# Patient Record
Sex: Female | Born: 1965 | Race: Black or African American | Hispanic: No | Smoking: Never smoker
Health system: Southern US, Community
[De-identification: ages and names within clinical notes are randomized; demographics above are authoritative.]

## PROBLEM LIST (undated history)

## (undated) DIAGNOSIS — R51 Headache: Secondary | ICD-10-CM

## (undated) DIAGNOSIS — I10 Essential (primary) hypertension: Secondary | ICD-10-CM

## (undated) DIAGNOSIS — R519 Headache, unspecified: Secondary | ICD-10-CM

## (undated) DIAGNOSIS — G932 Benign intracranial hypertension: Secondary | ICD-10-CM

## (undated) HISTORY — DX: Benign intracranial hypertension: G93.2

## (undated) HISTORY — DX: Essential (primary) hypertension: I10

## (undated) HISTORY — DX: Headache: R51

## (undated) HISTORY — DX: Headache, unspecified: R51.9

---

## 1990-07-01 HISTORY — PX: BREAST BIOPSY: SHX20

## 2010-07-01 HISTORY — PX: LAPAROSCOPIC TOTAL HYSTERECTOMY: SUR800

## 2010-07-31 ENCOUNTER — Emergency Department (HOSPITAL_COMMUNITY)
Admission: EM | Admit: 2010-07-31 | Discharge: 2010-07-31 | Payer: Self-pay | Source: Home / Self Care | Admitting: Emergency Medicine

## 2010-07-31 LAB — POCT I-STAT, CHEM 8
Glucose, Bld: 137 mg/dL — ABNORMAL HIGH (ref 70–99)
HCT: 35 % — ABNORMAL LOW (ref 36.0–46.0)
Hemoglobin: 11.9 g/dL — ABNORMAL LOW (ref 12.0–15.0)
Potassium: 3.9 mEq/L (ref 3.5–5.1)
Sodium: 140 mEq/L (ref 135–145)
TCO2: 23 mmol/L (ref 0–100)

## 2010-07-31 LAB — GLUCOSE, CSF: Glucose, CSF: 73 mg/dL (ref 43–76)

## 2010-07-31 LAB — CSF CELL COUNT WITH DIFFERENTIAL
RBC Count, CSF: 0 /mm3
Tube #: 3
WBC, CSF: 1 /mm3 (ref 0–5)

## 2010-07-31 LAB — CBC
MCH: 19.2 pg — ABNORMAL LOW (ref 26.0–34.0)
Platelets: 503 10*3/uL — ABNORMAL HIGH (ref 150–400)
RBC: 4.84 MIL/uL (ref 3.87–5.11)
RDW: 17.4 % — ABNORMAL HIGH (ref 11.5–15.5)

## 2010-07-31 LAB — GRAM STAIN

## 2010-07-31 LAB — PROTEIN, CSF: Total  Protein, CSF: 19 mg/dL (ref 15–45)

## 2010-07-31 LAB — DIFFERENTIAL
Basophils Absolute: 0 10*3/uL (ref 0.0–0.1)
Eosinophils Absolute: 0 10*3/uL (ref 0.0–0.7)
Lymphocytes Relative: 21 % (ref 12–46)
Lymphs Abs: 2.8 10*3/uL (ref 0.7–4.0)
Monocytes Relative: 7 % (ref 3–12)

## 2010-07-31 LAB — POCT CARDIAC MARKERS: Troponin i, poc: <0.05 ng/mL (ref 0.00–0.09)

## 2010-08-04 LAB — CSF CULTURE W GRAM STAIN
Culture: NO GROWTH
Gram Stain: NONE SEEN

## 2010-09-03 NOTE — Consult Note (Signed)
Rebecca Hobbs, Rebecca Hobbs               ACCOUNT NO.:  [id number]  MEDICAL RECORD NO.:  [id number]          PATIENT TYPE:  EMS  LOCATION:  [facility]                         FACILITY:  [facility]  PHYSICIAN:  Joycelyn Schmid, MD   DATE OF BIRTH:  [birth year]  DATE OF CONSULTATION:  07/31/2010 DATE OF DISCHARGE:  07/31/2010                                CONSULTATION   REASON FOR CONSULTATION:  Headache associated with left visual field deficit.  HISTORY OF PRESENT ILLNESS:  This is a 45 year old African American female with past medical history only significant for vaginal bleeding over the past 5-6 months and recently placed on Necon without any side effects.  The patient was at her baseline status up until Sunday mid afternoon when she noted bilateral trapezial and posterior neck discomfort.  This was followed by left periorbital discomfort and she describes a "black spot in the center of her revision with wavy lines inher left visual field."  The patient's periorbital discomfort and visual defects did not resolve Sunday evening.  In fact, she states that it progressively gotten worse over the day during Monday.  During the duration of Monday, her main complaint at that time was visual field defect in her left vision along with neck discomfort.  The patient was brought to the emergency department on July 31, 2010, for further evaluation.  While in the emergency room, the patient was given Reglan and Benadryl which she feels might have improved her headache and periorbital discomfort, however, she still is complaining of a black spot in her visual field on the left.  The patient was sent to MRI which showed no ventricular enlargement, edema, or stroke; however, did show positive optic nerve sheath dilatation.  At the present time, the patient has been sent for a fluoro-guided LP.  Past medical history includes vaginal bleeding over the past 5-6 months, recently placed on  Necon.  MEDICATIONS:  Necon.  ALLERGIES:  No known allergies to environmental factors or drugs.  FAMILY HISTORY:  Mother has a history of migraine headaches and hypertension.  Father is deceased secondary to lung cancer and also had hypertension.  SOCIAL HISTORY:  The patient does not use tobacco, alcohol, or illicit drugs.  REVIEW OF SYSTEMS:  Negative with the exception of above.  PHYSICAL EXAMINATION:  VITAL SIGNS:  The patient's temperature is 98.5, blood pressure 115/96, heart rate ranges between 129 and 88, respirations 20. LUNGS:  Clear to auscultation bilaterally. NECK:  Negative for bruits, supple with full range of motion. CARDIOVASCULAR:  Regular rate and rhythm. ABDOMEN:  Soft. HEENT:  Pupils are equal, round, reactive to light approximately 3-2 mm. Extraocular movements were grossly intact.  Tongue was midline.  Face was equal and symmetrical.  Vision was grossly intact to bilateral simultaneous stimuli. NEUROLOGIC:  Facial sensation was grossly intact throughout.  Coordination:  Finger-to-nose and heel-to-shin were smooth. MUSCULOSKELETAL:  The patient showed 5/5 strength throughout.  Sensation:  The patient full sensation to pinprick, light touch throughout.  Deep tendon reflexes show 2+, downgoing toes bilaterally.  Test reveals sodium 140, potassium 3.9, chloride 105, CO2 of 23, BUN 4, creatinine 0.9,  glucose 137.  White blood cell count was elevated at 13.3, platelets 503, hemoglobin 9.3, hematocrit 31.7.  EKG showed normal sinus rhythm, at times tachycardic.  MRI of brain showed negative for any acute infarct, mass, bleed. However, did show optic nerve sheath dilatation.  CT of head was negative.  Fluoro-guided LP is pending at this time.  ASSESSMENT:  This is a pleasant 45 year old African American female with 3-day history of neck discomfort, left periorbital discomfort, and left visual field abnormality.  At this time, the patient is undergoing  a fluoro-guided LP for further evaluation.  Differential diagnosis includes pseudotumor cerebri versus migraine headache.  RECOMMENDATION: 1. Follow up on fluoro-guided LP. 2. Follow up with Ophthalmology.  We will continue to follow the     patient while she is in the hospital.     [name], PA-C   ______________________________ Joycelyn Schmid, MD    DS/MEDQ  D:  07/31/2010  T:  08/01/2010  Job:  [id number]  cc:   Joycelyn Schmid, MD  Electronically Signed by [name] PA-C on 08/02/2010 09:09:25 AM Electronically Signed by Joycelyn Schmid  on 09/03/2010 12:21:17 PM

## 2010-10-22 ENCOUNTER — Ambulatory Visit: Payer: Self-pay | Admitting: Unknown Physician Specialty

## 2010-11-02 ENCOUNTER — Ambulatory Visit: Payer: Self-pay | Admitting: Unknown Physician Specialty

## 2010-11-05 LAB — PATHOLOGY REPORT

## 2013-06-16 ENCOUNTER — Ambulatory Visit: Payer: Self-pay | Admitting: Adult Health

## 2013-07-08 ENCOUNTER — Ambulatory Visit (INDEPENDENT_AMBULATORY_CARE_PROVIDER_SITE_OTHER): Payer: 59 | Admitting: Adult Health

## 2013-07-08 ENCOUNTER — Encounter: Payer: Self-pay | Admitting: Adult Health

## 2013-07-08 ENCOUNTER — Encounter (INDEPENDENT_AMBULATORY_CARE_PROVIDER_SITE_OTHER): Payer: Self-pay

## 2013-07-08 VITALS — BP 152/94 | HR 95 | Temp 97.9°F | Resp 12 | Ht 70.25 in | Wt 205.5 lb

## 2013-07-08 DIAGNOSIS — Z0000 Encounter for general adult medical examination without abnormal findings: Secondary | ICD-10-CM | POA: Insufficient documentation

## 2013-07-08 DIAGNOSIS — G479 Sleep disorder, unspecified: Secondary | ICD-10-CM

## 2013-07-08 DIAGNOSIS — F418 Other specified anxiety disorders: Secondary | ICD-10-CM | POA: Insufficient documentation

## 2013-07-08 DIAGNOSIS — F341 Dysthymic disorder: Secondary | ICD-10-CM

## 2013-07-08 DIAGNOSIS — Z23 Encounter for immunization: Secondary | ICD-10-CM | POA: Insufficient documentation

## 2013-07-08 DIAGNOSIS — I10 Essential (primary) hypertension: Secondary | ICD-10-CM | POA: Insufficient documentation

## 2013-07-08 DIAGNOSIS — Z1239 Encounter for other screening for malignant neoplasm of breast: Secondary | ICD-10-CM

## 2013-07-08 MED ORDER — SERTRALINE HCL 50 MG PO TABS: 50.0000 mg | ORAL_TABLET | Freq: Every day | ORAL | Status: DC

## 2013-07-08 MED ORDER — LISINOPRIL-HYDROCHLOROTHIAZIDE 10-12.5 MG PO TABS: 1.0000 | ORAL_TABLET | Freq: Every day | ORAL | Status: DC

## 2013-07-08 NOTE — Patient Instructions (Signed)
  Please have labs drawn at [facility] today - these have to be fasting labs.  Start Lisinopril/HCTZ 10mg /12.5mg  daily for blood pressure  Start Zoloft 50 mg daily for anxiety, depression.  Have second set of blood work to check kidney and potassium level 1 week after starting lisinopril  Return for follow up in 4 weeks.  Thank you for choosing [place] [facility].  The results of your labs will be available through MyChart for your convenience. Please remember to activate this. The activation code is located at the end of this form.  You received your flu vaccine today

## 2013-07-08 NOTE — Assessment & Plan Note (Signed)
Try melatonin or valerian root. Return in 1 month.

## 2013-07-08 NOTE — Assessment & Plan Note (Addendum)
Physical exam. Labs ordered: CBC with differential, comprehensive metabolic panel, TSH, vitamin D, B12, lipids. Patient will have these done at [facility]. Request medical records from previous PCP. Pap done in 2012 and patient reports normal. Next Will be due 2017. Mammogram scheduled.

## 2013-07-08 NOTE — Assessment & Plan Note (Addendum)
Start lisinopril/HCTZ 10 mg/12.5 mg. Baseline metabolic panel. Recheck creatinine and potassium in one week. Patient will have this drawn at [facility]. Followup in one month

## 2013-07-08 NOTE — Progress Notes (Signed)
Subjective:    Patient ID: Rebecca Hobbs, female    DOB: 03/09/1966, 48 y.o.   MRN: [id number]  HPI  Rebecca Hobbs is a very pleasant 48 y/o female who presents to establish care. She was previously followed by Dr. Shelah Hobbs in [place]. Will request records. She has been experiencing periods of crying. She reports these occur ~ 3 times per week. She is having increasing stress and work. Home life is good. Having trouble sleeping. Falls asleep easily but cannot stay asleep. Hx of pseudotumor cerebri syndrome. She was followed by Dr. Marjory Hobbs at [facility]. Took diamox for ~ 1 year with symptoms (HA) improving considerably. She is no longer on this medication and she has been discharged from Neurology.   Last Mammogram years ago. Schedule now PAP 2012 - Normal per her report - next in 2017 Tetanus 2012   Past Medical History  Diagnosis Date  . Hypertension     elevated b/p readings x 2 years.  . Frequent headaches   . Pseudotumor cerebri syndrome      Past Surgical History  Procedure Laterality Date  . Laparoscopic total hysterectomy  2012    Cervix intact     Family History  Problem Relation Age of Onset  . Hypertension Mother   . Hypertension Father   . COPD Father   . Heart disease Father   . Hypertension Brother   . Diabetes Maternal Grandmother   . Arthritis Maternal Grandfather   . Diabetes Paternal Grandmother   . Diverticulitis Sister   . Heart disease Brother 50    deceased from MI  . Hypertension Brother   . Hypertension Brother      History   Social History  . Marital Status: Divorced    Spouse Name: N/A    Number of Children: 1  . Years of Education: 12   Occupational History  . Project [employer]    18 years   Social History Main Topics  . Smoking status: Never Smoker   . Smokeless tobacco: Never Used  . Alcohol Use: Yes     Comment: 1 glass of wine monthly, if any  . Drug Use: No  . Sexual Activity: Yes    Birth  Control/ Protection: Surgical   Other Topics Concern  . Not on file   Social History Narrative   Rebecca Hobbs grew up in [place], [place]. She is divorced and has 1 daughter (Rebecca Hobbs age 70) and a grandson Rebecca Hobbs). Her daughter and grandson live with her. 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 She makes business cards, church bulletins or anything for special occasions. She attends Rebecca Hobbs and is very active in her church.     Review of Systems  Constitutional: Negative.   HENT: Negative.   Eyes: Negative.   Respiratory: Negative.   Cardiovascular: Negative for chest pain, palpitations and leg swelling.       Elevated b/p reading x 2 years. Has never been on medication.  Gastrointestinal: Negative.   Endocrine: Negative.   Genitourinary: Negative.   Musculoskeletal: Negative.   Skin: Negative.   Allergic/Immunologic: Negative.   Neurological: Negative.   Hematological: Negative.   Psychiatric/Behavioral: Positive for sleep disturbance. Negative for suicidal ideas, behavioral problems, confusion, decreased concentration and agitation. The patient is nervous/anxious.        Has taken Advil PM  to help her sleep. Has been experiencing crying episodes ~ 3 times weekly. Feels stressed at work.       Objective:   Physical Exam  Constitutional: She is oriented to person, place, and time. She appears well-developed and well-nourished. No distress.  HENT:  Head: Normocephalic and atraumatic.  Right Ear: External ear normal.  Left Ear: External ear normal.  Nose: Nose normal.  Mouth/Throat: Oropharynx is clear and moist.  Eyes: Conjunctivae and EOM are normal. Pupils are equal, round, and reactive to light.  Neck: Normal range of motion. Neck supple. No tracheal deviation present. No thyromegaly present.  Cardiovascular: Normal rate, regular rhythm,  normal heart sounds and intact distal pulses.  Exam reveals no gallop and no friction rub.   No murmur heard. Pulmonary/Chest: Effort normal and breath sounds normal. No respiratory distress. She has no wheezes. She has no rales.  Abdominal: Soft. Bowel sounds are normal. She exhibits no distension and no mass. There is no tenderness. There is no rebound and no guarding.  Musculoskeletal: Normal range of motion. She exhibits no edema and no tenderness.  Lymphadenopathy:    She has no cervical adenopathy.  Neurological: She is alert and oriented to person, place, and time. She has normal reflexes. No cranial nerve deficit. Coordination normal.  Skin: Skin is warm and dry.  Psychiatric: She has a normal mood and affect. Her behavior is normal. Judgment and thought content normal.    BP 152/94  Pulse 95  Temp(Src) 97.9 F (36.6 C) (Oral)  Resp 12  Ht 5' 10.25" (1.784 m)  Wt 205 lb 8 oz (93.214 kg)  BMI 29.29 kg/m2  SpO2 99%       Assessment & Plan:

## 2013-07-08 NOTE — Assessment & Plan Note (Signed)
Increasing work stressors. Crying spells. Start Zoloft 50 mg daily. Return to clinic for followup in one month

## 2013-07-08 NOTE — Assessment & Plan Note (Signed)
Mammogram ordered

## 2013-07-08 NOTE — Progress Notes (Signed)
Pre visit review using our clinic review tool, if applicable. No additional management support is needed unless otherwise documented below in the visit note. 

## 2013-07-08 NOTE — Assessment & Plan Note (Signed)
Vaccine administered during clinic today

## 2013-07-09 ENCOUNTER — Telehealth: Payer: Self-pay | Admitting: Adult Health

## 2013-07-09 NOTE — Telephone Encounter (Signed)
Relevant patient education assigned to patient using Emmi. ° °

## 2013-07-16 ENCOUNTER — Telehealth: Payer: Self-pay | Admitting: *Deleted

## 2013-07-16 NOTE — Telephone Encounter (Signed)
Raquel reviewed labs from [facility], all normal. Pt notified. States no complaints with Lisinopril HCT.

## 2013-08-05 ENCOUNTER — Other Ambulatory Visit: Payer: Self-pay | Admitting: Adult Health

## 2013-08-05 ENCOUNTER — Encounter: Payer: Self-pay | Admitting: Adult Health

## 2013-08-05 ENCOUNTER — Ambulatory Visit (INDEPENDENT_AMBULATORY_CARE_PROVIDER_SITE_OTHER): Payer: 59 | Admitting: Adult Health

## 2013-08-05 VITALS — BP 130/84 | HR 108 | Resp 12 | Wt 255.5 lb

## 2013-08-05 DIAGNOSIS — F341 Dysthymic disorder: Secondary | ICD-10-CM

## 2013-08-05 DIAGNOSIS — Z79899 Other long term (current) drug therapy: Secondary | ICD-10-CM

## 2013-08-05 DIAGNOSIS — I10 Essential (primary) hypertension: Secondary | ICD-10-CM

## 2013-08-05 DIAGNOSIS — F418 Other specified anxiety disorders: Secondary | ICD-10-CM

## 2013-08-05 MED ORDER — LISINOPRIL-HYDROCHLOROTHIAZIDE 10-12.5 MG PO TABS: 1.0000 | ORAL_TABLET | Freq: Every day | ORAL | Status: DC

## 2013-08-05 MED ORDER — SERTRALINE HCL 50 MG PO TABS: 50.0000 mg | ORAL_TABLET | Freq: Every day | ORAL | Status: DC

## 2013-08-05 NOTE — Progress Notes (Signed)
Patient ID: Rebecca Hobbs, female   DOB: 07-02-1965, 48 y.o.   MRN: [id number]    Subjective:    Patient ID: Rebecca Hobbs, female    DOB: 07-02-1965, 48 y.o.   MRN: [id number]  HPI  Patient is a pleasant 48 year old female who presents to clinic for followup of hypertension and depression with anxiety. She was started on lisinopril/HCTZ. She reports taking medication on a daily basis. No side effects. Blood pressure is well controlled. As far as her depression with anxiety, she was started on Zoloft. She reports improvement in her symptoms. She would like to continue this medication.   Past Medical History  Diagnosis Date  . Hypertension     elevated b/p readings x 2 years.  . Frequent headaches   . Pseudotumor cerebri syndrome     Current Outpatient Prescriptions on File Prior to Visit  Medication Sig Dispense Refill  . Multiple Vitamin (MULTIVITAMIN) tablet Take 1 tablet by mouth daily.       No current facility-administered medications on file prior to visit.     Review of Systems  Constitutional: Negative.   HENT: Negative.   Eyes: Negative.   Respiratory: Negative.  Negative for chest tightness and shortness of breath.   Cardiovascular: Negative.  Negative for chest pain, palpitations and leg swelling.       Blood pressure improved  Gastrointestinal: Negative.   Endocrine: Negative.   Genitourinary: Negative.   Musculoskeletal: Negative.   Skin: Negative.   Allergic/Immunologic: Negative.   Neurological: Negative.   Hematological: Negative.   Psychiatric/Behavioral: Negative.        Symptoms of depression and anxiety have improved since taking Zoloft       Objective:  BP 130/84  Pulse 108  Resp 12  Wt 255 lb 8 oz (115.894 kg)  SpO2 95%   Physical Exam  Constitutional: She is oriented to person, place, and time. She appears well-developed and well-nourished. No distress.  Cardiovascular: Normal rate, regular rhythm and normal heart sounds.  Exam reveals  no gallop and no friction rub.   No murmur heard. Pulmonary/Chest: Effort normal and breath sounds normal. No respiratory distress. She has no wheezes. She has no rales.  Musculoskeletal: Normal range of motion.  Neurological: She is alert and oriented to person, place, and time.  Skin: Skin is warm and dry.  Psychiatric: She has a normal mood and affect. Her behavior is normal. Judgment and thought content normal.          Assessment & Plan:    1. HTN (hypertension) Continue Lisinopril/HCTZ 10 mg/12.5 mg daily Refills provided. RTC for f/u 6 mo or sooner if necessary Monitor B/P weekly and report. Bring at next visit.  - Basic metabolic panel  2. Medication management Check labs since starting ACE/diuretic  - Basic metabolic panel  3. Depression with anxiety Doing well on Zoloft. Continue to follow. Refills sent. RTC for f/u 6 mo

## 2013-08-05 NOTE — Patient Instructions (Signed)
  Have your blood work drawn at [facility]   Keep record of your blood pressure readings weekly and bring with you at your next visit in 6 months.  I have sent in refills for your b/p medication and zoloft.  Please call with any questions or concerns.

## 2013-08-05 NOTE — Progress Notes (Signed)
Pre visit review using our clinic review tool, if applicable. No additional management support is needed unless otherwise documented below in the visit note. 

## 2013-08-06 ENCOUNTER — Telehealth: Payer: Self-pay | Admitting: Adult Health

## 2013-08-06 NOTE — Telephone Encounter (Signed)
Relevant patient education mailed to patient.  

## 2013-08-10 ENCOUNTER — Telehealth: Payer: Self-pay | Admitting: Adult Health

## 2013-08-10 ENCOUNTER — Encounter: Payer: Self-pay | Admitting: Adult Health

## 2013-08-10 ENCOUNTER — Other Ambulatory Visit: Payer: Self-pay | Admitting: Adult Health

## 2013-08-10 DIAGNOSIS — R928 Other abnormal and inconclusive findings on diagnostic imaging of breast: Secondary | ICD-10-CM

## 2013-08-10 NOTE — Telephone Encounter (Signed)
Pt called left message needs referral to surgeon she had an abnormal mammogram.  [place] image  08/07/13

## 2013-08-10 NOTE — Telephone Encounter (Signed)
Raquel, see below.

## 2013-08-12 ENCOUNTER — Encounter: Payer: Self-pay | Admitting: General Surgery

## 2013-08-13 ENCOUNTER — Telehealth: Payer: Self-pay | Admitting: *Deleted

## 2013-08-13 NOTE — Telephone Encounter (Signed)
Pt notified of labs, glucose 181. Pt states she was fasting. Raquel recommended repeat after she sees Dr. Lemar LivingsByrnett. Pt verbalized understanding, lab order for repeat labs left up front for pt pickup.

## 2013-08-18 ENCOUNTER — Other Ambulatory Visit: Payer: Self-pay | Admitting: Adult Health

## 2013-08-18 MED ORDER — ERGOCALCIFEROL 1.25 MG (50000 UT) PO CAPS: 50000.0000 [IU] | ORAL_CAPSULE | ORAL | Status: DC

## 2013-08-18 NOTE — Progress Notes (Signed)
Vitamin D level 9.6 Start Drisdol

## 2013-08-18 NOTE — Telephone Encounter (Signed)
Advised pt that her Vit D is low and Raquel called in Rx, and to make a follow up appt in 12 weeks.  Advised pt that her lab order slip is ready for pick up

## 2013-08-19 ENCOUNTER — Telehealth: Payer: Self-pay | Admitting: *Deleted

## 2013-08-19 ENCOUNTER — Encounter: Payer: Self-pay | Admitting: General Surgery

## 2013-08-19 ENCOUNTER — Ambulatory Visit (INDEPENDENT_AMBULATORY_CARE_PROVIDER_SITE_OTHER): Payer: 59 | Admitting: General Surgery

## 2013-08-19 VITALS — BP 124/78 | HR 78 | Resp 12 | Ht 70.0 in | Wt 253.0 lb

## 2013-08-19 DIAGNOSIS — R920 Mammographic microcalcification found on diagnostic imaging of breast: Secondary | ICD-10-CM

## 2013-08-19 NOTE — Patient Instructions (Addendum)
Patient to return in six months left breast diagnotic mammogram.  

## 2013-08-19 NOTE — Telephone Encounter (Signed)
Pt notified that Vit D is low and that Rx was sent to pharmacy.  Advised pt to follow up in 12 weeks and that the lab order slip was ready for pick up

## 2013-08-19 NOTE — Progress Notes (Addendum)
Patient ID: Rebecca Hobbs, female   DOB: 01-Dec-1965, 48 y.o.   MRN: [id number]  Chief Complaint  Patient presents with  . Other    mammmogram    HPI Rebecca Hobbs is a 48 y.o. female who presents for a breast evaluation. The most recent mammogram was done on 07/08/13. Patient had her last mammogram back in 1992. Patient does perform regular self breast checks and gets regular mammograms done.    HPI  Past Medical History  Diagnosis Date  . Hypertension     elevated b/p readings x 2 years.  . Frequent headaches   . Pseudotumor cerebri syndrome     Past Surgical History  Procedure Laterality Date  . Laparoscopic total hysterectomy  2012    Cervix intact  . Breast biopsy Left 1992    Family History  Problem Relation Age of Onset  . Hypertension Mother   . Hypertension Father   . COPD Father   . Heart disease Father   . Hypertension Brother   . Diabetes Maternal Grandmother   . Arthritis Maternal Grandfather   . Diabetes Paternal Grandmother   . Diverticulitis Sister   . Heart disease Brother 50    deceased from MI  . Hypertension Brother   . Hypertension Brother   . Breast cancer Maternal Aunt     Social History History  Substance Use Topics  . Smoking status: Never Smoker   . Smokeless tobacco: Never Used  . Alcohol Use: Yes     Comment: 1 glass of wine monthly, if any    No Known Allergies  Current Outpatient Prescriptions  Medication Sig Dispense Refill  . lisinopril-hydrochlorothiazide (PRINZIDE,ZESTORETIC) 10-12.5 MG per tablet Take 1 tablet by mouth daily.  30 tablet  6  . Multiple Vitamin (MULTIVITAMIN) tablet Take 1 tablet by mouth daily.      . sertraline (ZOLOFT) 50 MG tablet Take 1 tablet (50 mg total) by mouth daily.  30 tablet  6   No current facility-administered medications for this visit.    Review of Systems Review of Systems  Constitutional: Negative.   Respiratory: Negative.   Cardiovascular: Negative.     Blood pressure  124/78, pulse 78, resp. rate 12, height 5\' 10"  (1.778 m), weight 253 lb (114.76 kg).  Physical Exam Physical Exam  Vitals reviewed. Constitutional: She is oriented to person, place, and time. She appears well-developed and well-nourished.  Eyes: Conjunctivae are normal.  Cardiovascular: Normal rate and regular rhythm.   Murmur heard.  Systolic murmur is present with a grade of 1/6  Pulmonary/Chest: Breath sounds normal. Right breast exhibits no inverted nipple, no mass, no nipple discharge, no skin change and no tenderness. Left breast exhibits no inverted nipple, no mass, no nipple discharge, no skin change and no tenderness. Breasts are asymmetrical ( left breast 1/2 cup size bigger than right breast ).  Well healed incision left breast 5 cm from nipple.   Lymphadenopathy:    She has no cervical adenopathy.    She has no axillary adenopathy.  Neurological: She is alert and oriented to person, place, and time.  Skin: Skin is warm and dry.    Data Reviewed Screening mammograms dated 08/02/2013 as well as diagnostic images dated 08/06/2013 were reviewed. Calcifications were identified and left breast 6 cm from the nipple. BI-RAD-4. These cover a 3 mm area on focal spot compression views.  Assessment    Left breast microcalcifications, first examined 20 years.    Plan  Options for management were reviewed with the patient: 1) early stereotactic biopsy (risks/benefits reviewed) as well as 2) 6 month followup exam. The likelihood of a missed malignancy a small. At this time the patient is comfortable with a 6 month followup exam. She managed to go 20 years in between screening studies (original exam for a palpable mass) and she is not particularly anxious at this time. She was advised that should she desire to proceed early biopsy after reviewing options with her family she should contact the office directly.  Earline MayotteByrnett, Jeffrey W 08/25/2013, 3:07 PM

## 2013-08-20 DIAGNOSIS — R920 Mammographic microcalcification found on diagnostic imaging of breast: Secondary | ICD-10-CM | POA: Insufficient documentation

## 2013-08-23 LAB — BASIC METABOLIC PANEL
BUN/Creatinine Ratio: 11 (ref 9–23)
BUN: 9 mg/dL (ref 6–24)
CALCIUM: 10 mg/dL (ref 8.7–10.2)
CO2: 23 mmol/L (ref 18–29)
CREATININE: 0.82 mg/dL (ref 0.57–1.00)
Chloride: 96 mmol/L — ABNORMAL LOW (ref 97–108)
GFR calc Af Amer: 99 mL/min/{1.73_m2} (ref >59)
GFR, EST NON AFRICAN AMERICAN: 86 mL/min/{1.73_m2} (ref >59)
GLUCOSE: 181 mg/dL — AB (ref 65–99)
Potassium: 4.4 mmol/L (ref 3.5–5.2)
Sodium: 138 mmol/L (ref 134–144)

## 2013-08-25 ENCOUNTER — Encounter: Payer: Self-pay | Admitting: Adult Health

## 2013-08-25 ENCOUNTER — Other Ambulatory Visit: Payer: Self-pay | Admitting: Adult Health

## 2013-08-25 DIAGNOSIS — R739 Hyperglycemia, unspecified: Secondary | ICD-10-CM

## 2013-08-27 ENCOUNTER — Other Ambulatory Visit: Payer: Self-pay | Admitting: Adult Health

## 2013-08-27 NOTE — Telephone Encounter (Signed)
Mailed unread message to pt  

## 2013-09-06 ENCOUNTER — Telehealth: Payer: Self-pay | Admitting: Adult Health

## 2013-09-06 LAB — HGB A1C W/O EAG: HEMOGLOBIN A1C: 7.8 % — AB (ref 4.8–5.6)

## 2013-09-06 NOTE — Telephone Encounter (Signed)
Pt left vm asking for appt with R. Rey for elevated A1c.  Left vm for pt to return call to schedule.

## 2013-09-07 ENCOUNTER — Encounter: Payer: Self-pay | Admitting: Adult Health

## 2013-09-07 ENCOUNTER — Other Ambulatory Visit: Payer: Self-pay | Admitting: Adult Health

## 2013-09-07 MED ORDER — METFORMIN HCL 500 MG PO TABS: 500.0000 mg | ORAL_TABLET | Freq: Two times a day (BID) | ORAL | Status: DC

## 2013-09-07 MED ORDER — GLIMEPIRIDE 2 MG PO TABS: 2.0000 mg | ORAL_TABLET | Freq: Every day | ORAL | Status: DC

## 2013-09-07 NOTE — Progress Notes (Signed)
Notified pt. 

## 2013-09-08 ENCOUNTER — Ambulatory Visit (INDEPENDENT_AMBULATORY_CARE_PROVIDER_SITE_OTHER): Payer: 59 | Admitting: Adult Health

## 2013-09-08 ENCOUNTER — Encounter: Payer: Self-pay | Admitting: Adult Health

## 2013-09-08 VITALS — BP 130/90 | HR 95 | Temp 98.4°F | Resp 14 | Wt 258.0 lb

## 2013-09-08 DIAGNOSIS — R05 Cough: Secondary | ICD-10-CM | POA: Insufficient documentation

## 2013-09-08 DIAGNOSIS — E119 Type 2 diabetes mellitus without complications: Secondary | ICD-10-CM | POA: Insufficient documentation

## 2013-09-08 DIAGNOSIS — R059 Cough, unspecified: Secondary | ICD-10-CM | POA: Insufficient documentation

## 2013-09-08 MED ORDER — BENZONATATE 200 MG PO CAPS: 200.0000 mg | ORAL_CAPSULE | Freq: Two times a day (BID) | ORAL | Status: DC | PRN

## 2013-09-08 NOTE — Patient Instructions (Addendum)
For your cough I have sent in a prescription for tessalon to your pharmacy. You may take this twice a day as needed for cough.   For Diabetes:  I am referring you for diabetic education at [facility]. They will contact you for the details.  For the first two week check your blood glucose level twice a day (before breakfast and dinner) and record in your log. Thereafter check your blood glucose level in the morning before breakfast.  Take the glimepiride in the morning with breakfast and the metformin with breakfast and dinner.   Return for follow up in 3 months. I will check your HgbA1c at that time.  Please feel free to send me a MyChart messages if you have any questions.     Hypoglycemia (Low Blood Sugar) Hypoglycemia is when the glucose (sugar) in your blood is too low. Hypoglycemia can happen for many reasons. It can happen to people with or without diabetes. Hypoglycemia can develop quickly and can be a medical emergency.  CAUSES  Having hypoglycemia does not mean that you will develop diabetes. Different causes include:  Missed or delayed meals or not enough carbohydrates eaten.  Medication overdose. This could be by accident or deliberate. If by accident, your medication may need to be adjusted or changed.  Exercise or increased activity without adjustments in carbohydrates or medications.  A nerve disorder that affects body functions like your heart rate, blood pressure and digestion (autonomic neuropathy).  A condition where the stomach muscles do not function properly (gastroparesis). Therefore, medications may not absorb properly.  The inability to recognize the signs of hypoglycemia (hypoglycemic unawareness).  Absorption of insulin  may be altered.  Alcohol consumption.  Pregnancy/menstrual cycles/postpartum. This may be due to hormones.  Certain kinds of tumors. This is very rare. SYMPTOMS   Sweating.  Hunger.  Dizziness.  Blurred  vision.  Drowsiness.  Weakness.  Headache.  Rapid heart beat.  Shakiness.  Nervousness. DIAGNOSIS  Diagnosis is made by monitoring blood glucose in one or all of the following ways:  Fingerstick blood glucose monitoring.  Laboratory results. TREATMENT  If you think your blood glucose is low:  Check your blood glucose, if possible. If it is less than 70 mg/dl, take one of the following:  3-4 glucose tablets.   cup juice (prefer clear like apple).   cup "regular" soda pop.  1 cup milk.  -1 tube of glucose gel.  5-6 hard candies.  Do not over treat because your blood glucose (sugar) will only go too high.  Wait 15 minutes and recheck your blood glucose. If it is still less than 70 mg/dl (or below your target range), repeat treatment.  Eat a snack if it is more than one hour until your next meal. Sometimes, your blood glucose may go so low that you are unable to treat yourself. You may need someone to help you. You may even pass out or be unable to swallow. This may require you to get an injection of glucagon, which raises the blood glucose. HOME CARE INSTRUCTIONS  Check blood glucose as recommended by your caregiver.  Take medication as prescribed by your caregiver.  Follow your meal plan. Do not skip meals. Eat on time.  If you are going to drink alcohol, drink it only with meals.  Check your blood glucose before driving.  Check your blood glucose before and after exercise. If you exercise longer or different than usual, be sure to check blood glucose more frequently.  Always carry treatment with you. Glucose tablets are the easiest to carry.  Always wear medical alert jewelry or carry some form of identification that states that you have diabetes. This will alert people that you have diabetes. If you have hypoglycemia, they will have a better idea on what to do. SEEK MEDICAL CARE IF:   You are having problems keeping your blood sugar at target  range.  You are having frequent episodes of hypoglycemia.  You feel you might be having side effects from your medicines.  You have symptoms of an illness that is not improving after 3-4 days.  You notice a change in vision or a new problem with your vision. SEEK IMMEDIATE MEDICAL CARE IF:   You are a family member or friend of a person whose blood glucose goes below 70 mg/dl and is accompanied by:  Confusion.  A change in mental status.  The inability to swallow.  Passing out. Document Released: 06/17/2005 Document Revised: 09/09/2011 Document Reviewed: 10/14/2011 [facility] Patient Information 2014 [place], [place].

## 2013-09-08 NOTE — Progress Notes (Signed)
Pre visit review using our clinic review tool, if applicable. No additional management support is needed unless otherwise documented below in the visit note. 

## 2013-09-08 NOTE — Progress Notes (Signed)
Patient ID: LEKESHIA KRAM, female   DOB: February 15, 1966, 48 y.o.   MRN: [id number]    Subjective:    Patient ID: Ferol Luz, female    DOB: 10/15/1965, 48 y.o.   MRN: [id number]  HPI  Lacie is a pleasant 48 year old female newly diagnosed diabetes type 2 with hemoglobin A1c 7.8% who presents to clinic to discuss treatment. She is motivated to keep her blood glucose under control. Last eye appointment was 2 years ago.   She also reports recent cold but now has lingering dry cough. She has been taking mucinex. Hx of bronchitis ~ 1 year ago.  Past Medical History  Diagnosis Date  . Hypertension     elevated b/p readings x 2 years.  . Frequent headaches   . Pseudotumor cerebri syndrome     Current Outpatient Prescriptions on File Prior to Visit  Medication Sig Dispense Refill  . glimepiride (AMARYL) 2 MG tablet Take 1 tablet (2 mg total) by mouth daily before breakfast.  30 tablet  3  . lisinopril-hydrochlorothiazide (PRINZIDE,ZESTORETIC) 10-12.5 MG per tablet Take 1 tablet by mouth daily.  30 tablet  6  . metFORMIN (GLUCOPHAGE) 500 MG tablet Take 1 tablet (500 mg total) by mouth 2 (two) times daily with a meal.  60 tablet  3  . Multiple Vitamin (MULTIVITAMIN) tablet Take 1 tablet by mouth daily.      . sertraline (ZOLOFT) 50 MG tablet Take 1 tablet (50 mg total) by mouth daily.  30 tablet  6   No current facility-administered medications on file prior to visit.   Review of Systems  Constitutional: Negative.   HENT: Negative.   Eyes: Negative.   Respiratory: Negative.   Cardiovascular: Negative.   Gastrointestinal: Negative.   Endocrine: Negative.   Genitourinary: Negative.   Musculoskeletal: Negative.   Skin: Negative.   Allergic/Immunologic: Negative.   Neurological: Negative.   Hematological: Negative.   Psychiatric/Behavioral: Negative.        Objective:  BP 130/90  Pulse 95  Temp(Src) 98.4 F (36.9 C) (Oral)  Resp 14  Wt 258 lb (117.028 kg)  SpO2 93%   Physical Exam  Constitutional: She is oriented to person, place, and time. She appears well-developed and well-nourished. No distress.  HENT:  Head: Normocephalic and atraumatic.  Mouth/Throat: Oropharynx is clear and moist.  Eyes: Conjunctivae and EOM are normal.  Neck: Normal range of motion. Neck supple.  Cardiovascular: Normal rate, regular rhythm, normal heart sounds and intact distal pulses.  Exam reveals no gallop and no friction rub.   No murmur heard. Pulmonary/Chest: Effort normal and breath sounds normal. No respiratory distress. She has no wheezes. She has no rales.  Musculoskeletal: Normal range of motion.  Neurological: She is alert and oriented to person, place, and time. She has normal reflexes.  Skin: Skin is warm and dry.  Psychiatric: She has a normal mood and affect. Her behavior is normal. Judgment and thought content normal.       Assessment & Plan:   1. DM type 2 (diabetes mellitus, type 2) HgbA1c 7.8%. Started glimepiride and metformin. Discussed medication side effects and function. Discussed importance of maintaining good glycemic control to prevent end organ damage. She will schedule a diabetic eye appointment at her earliest convenience. Reviewed diet and will refer to [facility] for diabetic education. Pt was instructed on proper use of glucometer. Check BG bid x 2 weeks and records. Thereafter check daily before breakfast. Information and treatment of hypoglycemia provided.  Importance of exercise and weight control was also discussed. RTC for follow up in 3 months.  - Ambulatory referral to diabetic education  2. Cough Occasional coughing spells. Dry cough. Discussed with pt that cough can linger for several weeks. No shortness of breath reported. No wheezing. No hx of asthma. Tessalon for cough. If no improvement within 1-2 week or if symptoms worsen she will need to be reevaluated.   - benzonatate (TESSALON) 200 MG capsule; Take 1 capsule (200 mg  total) by mouth 2 (two) times daily as needed for cough.  Dispense: 20 capsule; Refill: 0

## 2013-09-13 ENCOUNTER — Encounter: Payer: Self-pay | Admitting: Adult Health

## 2013-09-14 ENCOUNTER — Telehealth: Payer: Self-pay

## 2013-09-14 NOTE — Telephone Encounter (Signed)
Relevant patient education assigned to patient using Emmi. ° °

## 2013-10-05 ENCOUNTER — Telehealth: Payer: Self-pay | Admitting: *Deleted

## 2013-10-05 NOTE — Telephone Encounter (Signed)
Yes, it may be the lisinopril. Have her hold the lisinopril to see if improved cough. I will need to call in a new b/p medication.

## 2013-10-05 NOTE — Telephone Encounter (Signed)
Pt states that the cough medication that she was given at last office visit hasn't been working, Pt is questioning if the cough may be coming from her blood pressure medication

## 2013-10-05 NOTE — Telephone Encounter (Signed)
Left vm advising pt  

## 2013-10-06 NOTE — Telephone Encounter (Signed)
Rebecca FanningJulie, Tomorrow call patient to find out how she is doing. I have not sent in a new prescription for b/p medication yet because I wanted to see if stopping the lisinopril helped her symptoms any.

## 2013-10-07 NOTE — Telephone Encounter (Signed)
Left vm requesting pt to return my phone call 

## 2013-10-07 NOTE — Telephone Encounter (Signed)
Pt states that yesterday was the first day she didn't take the Lisinopril, she can tell a little difference but the cough isnt completely gone

## 2013-10-08 NOTE — Telephone Encounter (Signed)
Please call Ms. Dayton ScrapeMurray and find out how she's doing. Has she taken her blood pressure since off the lisinopril? What are the readings?

## 2013-10-11 NOTE — Telephone Encounter (Signed)
Pt states that her cough is better, only coughs a little and feels that it is due to the pollen in the air.  Blood pressure readings have been 160-170's over 80's

## 2013-10-11 NOTE — Telephone Encounter (Signed)
Pt will be picking up some otc antihistamine and she will retry her lisinopril again as well

## 2013-10-11 NOTE — Telephone Encounter (Signed)
Left message on vm requesting pt to return my call

## 2013-10-11 NOTE — Telephone Encounter (Signed)
Does she want to retry the lisinopril? Tell her to take an otc antihistamine - claritin, allegra or zyrtec

## 2013-12-15 ENCOUNTER — Encounter: Payer: Self-pay | Admitting: Adult Health

## 2013-12-15 ENCOUNTER — Ambulatory Visit (INDEPENDENT_AMBULATORY_CARE_PROVIDER_SITE_OTHER): Payer: 59 | Admitting: Adult Health

## 2013-12-15 VITALS — BP 144/94 | HR 106 | Temp 98.3°F | Resp 14 | Ht 70.25 in | Wt 250.0 lb

## 2013-12-15 DIAGNOSIS — R059 Cough, unspecified: Secondary | ICD-10-CM

## 2013-12-15 DIAGNOSIS — E119 Type 2 diabetes mellitus without complications: Secondary | ICD-10-CM

## 2013-12-15 DIAGNOSIS — R05 Cough: Secondary | ICD-10-CM

## 2013-12-15 MED ORDER — LOSARTAN POTASSIUM-HCTZ 50-12.5 MG PO TABS: 1.0000 | ORAL_TABLET | Freq: Every day | ORAL | Status: DC

## 2013-12-15 NOTE — Progress Notes (Signed)
Pre visit review using our clinic review tool, if applicable. No additional management support is needed unless otherwise documented below in the visit note. 

## 2013-12-15 NOTE — Patient Instructions (Signed)
  Please have your labs drawn  Schedule your diabetic eye exam  Diabetic foot exam done today and normal.  Stop lisinopril and I will send in new prescription for losartan/HCTZ

## 2013-12-15 NOTE — Progress Notes (Signed)
Patient ID: Rebecca LuzRhonda M Hobbs, female   DOB: 04-Jul-1965, 48 y.o.   MRN: [id number]   Subjective:    Patient ID: Rebecca Hobbs, female    DOB: 04-Jul-1965, 48 y.o.   MRN: [id number]  HPI  Patient is a pleasant 48 year old female who presents to clinic for diabetes followup. She needs to schedule her diabetic eye exam. Patient reports that she will do this soon. She is due today for her diabetic foot exam, lipid panel and repeat hemoglobin A1c. She reports checking her blood glucose levels approximately twice a day. Fasting a.m. glucose ranges 120-130. She reports evening glucose levels approximately the same. She has experienced 2 hypoglycemic events where she felt shaky and sweaty. Patient drank orange juice with good results. She followed this by having a small meal. Patient reports that she usually has snacks at work but does not usually carry anything with her in her purse when she is out.  She reports ongoing cough. We had temporarily taken her off lisinopril with some relief. She was restarted on this medication secondary to also having respiratory infection when she was experiencing the cough so we really didn't know which was the reason for her cough. Patient reports that cough is ongoing. No upper respiratory symptom.   Past Medical History  Diagnosis Date  . Hypertension     elevated b/p readings x 2 years.  . Frequent headaches   . Pseudotumor cerebri syndrome     Current Outpatient Prescriptions on File Prior to Visit  Medication Sig Dispense Refill  . glimepiride (AMARYL) 2 MG tablet Take 1 tablet (2 mg total) by mouth daily before breakfast.  30 tablet  3  . metFORMIN (GLUCOPHAGE) 500 MG tablet Take 1 tablet (500 mg total) by mouth 2 (two) times daily with a meal.  60 tablet  3  . Multiple Vitamin (MULTIVITAMIN) tablet Take 1 tablet by mouth daily.      . sertraline (ZOLOFT) 50 MG tablet Take 1 tablet (50 mg total) by mouth daily.  30 tablet  6   No current facility-administered  medications on file prior to visit.     Review of Systems  Constitutional: Negative.   HENT: Negative.   Eyes: Negative.   Respiratory: Negative.   Cardiovascular: Negative.   Gastrointestinal: Negative.   Endocrine: Negative.   Genitourinary: Negative.   Musculoskeletal: Negative.   Skin: Negative.   Allergic/Immunologic: Negative.   Neurological: Negative.   Hematological: Negative.   Psychiatric/Behavioral: Negative.        Objective:  BP 144/94  Pulse 106  Temp(Src) 98.3 F (36.8 C) (Oral)  Resp 14  Ht 5' 10.25" (1.784 m)  Wt 250 lb (113.399 kg)  BMI 35.63 kg/m2  SpO2 97%   Physical Exam  Constitutional: She is oriented to person, place, and time. No distress.  HENT:  Head: Normocephalic and atraumatic.  Eyes: Conjunctivae and EOM are normal.  Neck: Normal range of motion. Neck supple.  Cardiovascular: Normal rate, regular rhythm, normal heart sounds and intact distal pulses.  Exam reveals no gallop and no friction rub.   No murmur heard. Pulmonary/Chest: Effort normal and breath sounds normal. No respiratory distress. She has no wheezes. She has no rales.  Musculoskeletal: Normal range of motion.  Neurological: She is alert and oriented to person, place, and time. She has normal reflexes. Coordination normal.  Skin: Skin is warm and dry.  Psychiatric: She has a normal mood and affect. Her behavior is normal. Judgment and thought content normal.  Assessment & Plan:   1. DMII (diabetes mellitus, type 2) Doing well with diet. Compliant with medication. Check labs. She will have these done at [facility]. Diabetic foot exam normal. She will schedule her diabetic yearly eye exam.   - Lipid panel - Hemoglobin A1c; Future  2. Cough Stop lisinopril/HCTZ. Start Losartan/HCTZ 50 mg/12.5 mg. She will call if cough persists.

## 2013-12-16 LAB — HEMOGLOBIN A1C: HEMOGLOBIN A1C: 6.5 % — AB (ref 4.0–6.0)

## 2013-12-16 LAB — LIPID PANEL: LDL Cholesterol: 100 mg/dL

## 2013-12-26 ENCOUNTER — Telehealth: Payer: Self-pay | Admitting: Adult Health

## 2013-12-26 NOTE — Telephone Encounter (Signed)
A1c has improved (6.5%) F/U 3 month

## 2013-12-28 ENCOUNTER — Telehealth: Payer: Self-pay | Admitting: Adult Health

## 2013-12-28 NOTE — Telephone Encounter (Signed)
Mailed unread message to pt  

## 2013-12-28 NOTE — Telephone Encounter (Signed)
Pt has not read MyChart message 

## 2014-01-04 ENCOUNTER — Encounter: Payer: Self-pay | Admitting: Adult Health

## 2014-01-24 ENCOUNTER — Encounter: Payer: Self-pay | Admitting: *Deleted

## 2014-01-24 NOTE — Progress Notes (Signed)
Chart reviewed for DM bundle. Appt sch 02/02/14. Labs abstracted from 12/16/13

## 2014-02-02 ENCOUNTER — Ambulatory Visit (INDEPENDENT_AMBULATORY_CARE_PROVIDER_SITE_OTHER): Payer: 59 | Admitting: Adult Health

## 2014-02-02 ENCOUNTER — Encounter: Payer: Self-pay | Admitting: Adult Health

## 2014-02-02 VITALS — BP 132/92 | HR 87 | Temp 98.6°F | Resp 14 | Ht 70.25 in | Wt 253.5 lb

## 2014-02-02 DIAGNOSIS — I10 Essential (primary) hypertension: Secondary | ICD-10-CM

## 2014-02-02 MED ORDER — LOSARTAN POTASSIUM-HCTZ 100-12.5 MG PO TABS: 1.0000 | ORAL_TABLET | Freq: Every day | ORAL | Status: DC

## 2014-02-02 NOTE — Progress Notes (Signed)
Patient ID: Rebecca Hobbs, female   DOB: Nov 04, 1965, 48 y.o.   MRN: [id number]   Subjective:    Patient ID: Rebecca Luzhonda M Yaney, female    DOB: Nov 04, 1965, 48 y.o.   MRN: [id number]  HPI Bjorn LoserRhonda is a pleasant 48 y/o female who presents to clinic for HTN follow up. Currently on losartan/HCTZ 50-12.5 mg daily. She reports that her diastolic readings have been in the high 80 (86-89). Her systolic readings have been below 135. She is feeling well overall. She reports stress at work but manageable. Trying to lose weight but this has been a challenge for her. Watches sodium in diet.   Past Medical History  Diagnosis Date  . Hypertension     elevated b/p readings x 2 years.  . Frequent headaches   . Pseudotumor cerebri syndrome     Current Outpatient Prescriptions on File Prior to Visit  Medication Sig Dispense Refill  . glimepiride (AMARYL) 2 MG tablet Take 1 tablet (2 mg total) by mouth daily before breakfast.  30 tablet  3  . metFORMIN (GLUCOPHAGE) 500 MG tablet Take 1 tablet (500 mg total) by mouth 2 (two) times daily with a meal.  60 tablet  3  . Multiple Vitamin (MULTIVITAMIN) tablet Take 1 tablet by mouth daily.      . sertraline (ZOLOFT) 50 MG tablet Take 1 tablet (50 mg total) by mouth daily.  30 tablet  6   No current facility-administered medications on file prior to visit.     Review of Systems  Constitutional: Negative.   HENT: Negative.   Eyes: Negative.   Respiratory: Negative.   Cardiovascular: Negative.   Gastrointestinal: Negative.   Endocrine: Negative.   Genitourinary: Negative.   Musculoskeletal: Negative.   Skin: Negative.   Allergic/Immunologic: Negative.   Neurological: Negative.   Hematological: Negative.   Psychiatric/Behavioral: Negative.        Objective:  BP 132/92  Pulse 87  Temp(Src) 98.6 F (37 C) (Oral)  Resp 14  Ht 5' 10.25" (1.784 m)  Wt 253 lb 8 oz (114.987 kg)  BMI 36.13 kg/m2  SpO2 97%   Physical Exam  Constitutional: She is oriented  to person, place, and time. No distress.  HENT:  Head: Normocephalic and atraumatic.  Eyes: Conjunctivae and EOM are normal.  Neck: Normal range of motion. Neck supple.  Cardiovascular: Normal rate, regular rhythm, normal heart sounds and intact distal pulses.  Exam reveals no gallop and no friction rub.   No murmur heard. Pulmonary/Chest: Effort normal and breath sounds normal. No respiratory distress. She has no wheezes. She has no rales.  Musculoskeletal: Normal range of motion.  Neurological: She is alert and oriented to person, place, and time. She has normal reflexes. Coordination normal.  Skin: Skin is warm and dry.  Psychiatric: She has a normal mood and affect. Her behavior is normal. Judgment and thought content normal.      Assessment & Plan:   1. Essential hypertension Increase losartan to 100 mg and keep HCTZ at 12.5 mg. Prescription sent to pharmacy. Follow up in 6 months for diabetes and HTN.

## 2014-02-02 NOTE — Progress Notes (Signed)
Pre visit review using our clinic review tool, if applicable. No additional management support is needed unless otherwise documented below in the visit note. 

## 2014-02-02 NOTE — Patient Instructions (Signed)
  I have sent in a new prescription for your blood pressure medication. I have increased the losartan to 100 mg and kept the HCTZ (fluid portion) the same at 12.5 mg.  Have your labs drawn at [facility] as soon as possible.  Return in 6 months for diabetes and hypertension follow up.  Enjoy the rest of your summer.

## 2014-02-03 LAB — BASIC METABOLIC PANEL
BUN: 9 mg/dL (ref 4–21)
Creatinine: 0.8 mg/dL (ref 0.5–1.1)
GLUCOSE: 109 mg/dL
Potassium: 4.6 mmol/L (ref 3.4–5.3)
Sodium: 140 mmol/L (ref 137–147)

## 2014-02-04 ENCOUNTER — Encounter: Payer: Self-pay | Admitting: Adult Health

## 2014-02-08 ENCOUNTER — Encounter: Payer: Self-pay | Admitting: General Surgery

## 2014-02-17 ENCOUNTER — Ambulatory Visit: Payer: 59 | Admitting: General Surgery

## 2014-04-21 ENCOUNTER — Encounter: Payer: Self-pay | Admitting: *Deleted

## 2014-04-29 ENCOUNTER — Other Ambulatory Visit: Payer: Self-pay | Admitting: *Deleted

## 2014-04-29 MED ORDER — METFORMIN HCL 500 MG PO TABS: 500.0000 mg | ORAL_TABLET | Freq: Two times a day (BID) | ORAL | Status: DC

## 2014-05-02 ENCOUNTER — Encounter: Payer: Self-pay | Admitting: Adult Health

## 2014-08-02 ENCOUNTER — Other Ambulatory Visit: Payer: Self-pay | Admitting: *Deleted

## 2014-08-02 MED ORDER — GLIMEPIRIDE 2 MG PO TABS: 2.0000 mg | ORAL_TABLET | Freq: Every day | ORAL | Status: DC

## 2014-08-05 ENCOUNTER — Encounter: Payer: Self-pay | Admitting: Nurse Practitioner

## 2014-08-05 ENCOUNTER — Ambulatory Visit (INDEPENDENT_AMBULATORY_CARE_PROVIDER_SITE_OTHER): Payer: Self-pay | Admitting: Nurse Practitioner

## 2014-08-05 ENCOUNTER — Ambulatory Visit: Payer: 59 | Admitting: Internal Medicine

## 2014-08-05 DIAGNOSIS — E119 Type 2 diabetes mellitus without complications: Secondary | ICD-10-CM

## 2014-08-05 DIAGNOSIS — E669 Obesity, unspecified: Secondary | ICD-10-CM

## 2014-08-05 DIAGNOSIS — F418 Other specified anxiety disorders: Secondary | ICD-10-CM

## 2014-08-05 DIAGNOSIS — I10 Essential (primary) hypertension: Secondary | ICD-10-CM

## 2014-08-05 MED ORDER — ESCITALOPRAM OXALATE 10 MG PO TABS: 10.0000 mg | ORAL_TABLET | Freq: Every day | ORAL | Status: DC

## 2014-08-05 MED ORDER — AMLODIPINE BESYLATE 5 MG PO TABS: 5.0000 mg | ORAL_TABLET | Freq: Every day | ORAL | Status: DC

## 2014-08-05 NOTE — Progress Notes (Signed)
Pre visit review using our clinic review tool, if applicable. No additional management support is needed unless otherwise documented below in the visit note. 

## 2014-08-05 NOTE — Patient Instructions (Addendum)
Follow up in 1 month for blood pressure. Please bring in readings.  Please visit the lab before leaving today.   We will contact you with your results via MyChart.

## 2014-08-05 NOTE — Progress Notes (Signed)
Subjective:    Patient ID: Rebecca Hobbs, female    DOB: [birth year], 49 y.o.   MRN: [id number]  HPI  Rebecca Hobbs is a 49 yo female with a CC of medication changes for diabetes, HTN, and anxiety/depression.    1)Diabetes Type II:  BS is reportedly Higher in morning   Metformin upsets stomach- diarrhea  Eye exam- 2015- Oct. Denies retinopathy Foot exam - due in 6/16   A1c and Microalbumin are due today Denies hypoglycemic events and issues with feet  Diet- Cutting out sugary drinks, watching sugar in diet Exercise- Walking 7 days 30 min.   Pt has goals regarding weight loss. She wants to include fresh fruits and veggies into her diet and increase walking when the weather is warmer.   Taking Glimpiride 2 mg tablet daily only since stopping metformin on her own.   2) HTN: BP bottom number a little high, top number normal at home   Does not season with salt, less stressed recently    3) Depression/Anxiety- Heart beating fast and upset stomach- Zoloft- over a year. She stopped taking it and states now she feels more emotional. She would like to try something else.     Review of Systems  Constitutional: Negative for fever, chills, diaphoresis and fatigue.  Eyes: Negative for visual disturbance.  Respiratory: Negative for chest tightness, shortness of breath and wheezing.   Cardiovascular: Negative for chest pain, palpitations and leg swelling.  Gastrointestinal: Negative for nausea, vomiting, diarrhea and rectal pain.  Endocrine: Negative for polydipsia, polyphagia and polyuria.  Skin: Negative for rash.  Neurological: Negative for dizziness, weakness, numbness and headaches.  Psychiatric/Behavioral: Negative for suicidal ideas and sleep disturbance. The patient is not nervous/anxious.        More emotional   Past Medical History  Diagnosis Date  . Hypertension     elevated b/p readings x 2 years.  . Frequent headaches   . Pseudotumor cerebri syndrome     History   Social  History  . Marital Status: Divorced    Spouse Name: N/A    Number of Children: 1  . Years of Education: 12   Occupational History  . Project [employer]    18 years   Social History Main Topics  . Smoking status: Never Smoker   . Smokeless tobacco: Never Used  . Alcohol Use: Yes     Comment: 1 glass of wine monthly, if any  . Drug Use: No  . Sexual Activity: Yes    Birth Control/ Protection: Surgical   Other Topics Concern  . Not on file   Social History Narrative   Rebecca Hobbs grew up in [place], [place]. She is divorced and has 1 daughter (Rebecca Hobbs age 49) and a grandson Rebecca Hobbs age 72 months). Her daughter and grandson live with her. 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 She makes business cards, church bulletins or anything for special occasions. She attends [place] [employer] [address] and is very active in her church.    Past Surgical History  Procedure Laterality Date  . Laparoscopic total hysterectomy  2012    Cervix intact  . Breast biopsy Left 1992    Family History  Problem Relation Age of Onset  . Hypertension Mother   . Hypertension Father   . COPD Father   . Heart disease Father   . Hypertension Brother   .  Diabetes Maternal Grandmother   . Arthritis Maternal Grandfather   . Diabetes Paternal Grandmother   . Diverticulitis Sister   . Heart disease Brother 50    deceased from MI  . Hypertension Brother   . Hypertension Brother   . Breast cancer Maternal Aunt     Allergies  Allergen Reactions  . Lisinopril Cough    Current Outpatient Prescriptions on File Prior to Visit  Medication Sig Dispense Refill  . glimepiride (AMARYL) 2 MG tablet Take 1 tablet (2 mg total) by mouth daily before breakfast. 30 tablet 0  . losartan-hydrochlorothiazide (HYZAAR) 100-12.5 MG per tablet Take 1 tablet by mouth daily. 30 tablet 6  . Multiple Vitamin  (MULTIVITAMIN) tablet Take 1 tablet by mouth daily.     No current facility-administered medications on file prior to visit.       Objective:   Physical Exam  Constitutional: She is oriented to person, place, and time. She appears well-developed and well-nourished. No distress.  BP 150/90 mmHg  Pulse 96  Temp(Src) 98.3 F (36.8 C) (Oral)  Resp 12  Ht 5\' 10"  (1.778 m)  Wt 266 lb 12.8 oz (121.02 kg)  BMI 38.28 kg/m2  SpO2 94%   HENT:  Head: Normocephalic and atraumatic.  Right Ear: External ear normal.  Left Ear: External ear normal.  Eyes: EOM are normal. Pupils are equal, round, and reactive to light. Right eye exhibits no discharge. Left eye exhibits no discharge. No scleral icterus.  Neck: Normal range of motion. Neck supple. No thyromegaly present.  Cardiovascular: Normal rate, regular rhythm, normal heart sounds and intact distal pulses.  Exam reveals no gallop and no friction rub.   No murmur heard. Pulmonary/Chest: Effort normal and breath sounds normal. No respiratory distress. She has no wheezes. She has no rales. She exhibits no tenderness.  Lymphadenopathy:    She has no cervical adenopathy.  Neurological: She is alert and oriented to person, place, and time. No cranial nerve deficit. She exhibits normal muscle tone. Coordination normal.  Skin: Skin is warm and dry. No rash noted. She is not diaphoretic.  Psychiatric: She has a normal mood and affect. Her behavior is normal. Judgment and thought content normal.      Assessment & Plan:

## 2014-08-06 DIAGNOSIS — E669 Obesity, unspecified: Secondary | ICD-10-CM | POA: Insufficient documentation

## 2014-08-06 LAB — CBC AND DIFFERENTIAL: Hemoglobin: 7.6 g/dL — AB (ref 12.0–16.0)

## 2014-08-06 NOTE — Assessment & Plan Note (Signed)
Not controlled. Pt is maxed out on Hyzaar. Will add amlodipine 5 mg daily. Asked pt to check at home and bring the numbers to next visit. Encouraged to watch salt and drink lots of water. FU 1 month.

## 2014-08-06 NOTE — Assessment & Plan Note (Signed)
Stable. 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 Eye exam up to date and foot exam was within the last year (due 6/16). Pt stopped Metformin and is only taking Glimepiride 2 mg daily. Discussed adding other medications if A1c comes back higher. FU 3 months.    Lab Results  Component Value Date   HGBA1C 6.5* 12/16/2013   HGBA1C 7.8* 08/27/2013   Lab Results  Component Value Date   LDLCALC 100 12/16/2013   CREATININE 0.8 02/03/2014

## 2014-08-06 NOTE — Assessment & Plan Note (Signed)
Worsening. Pt does not like Zoloft. Will switch to another drug within the SSRI class- Lexapro 10 mg daily. FU 1 month.

## 2014-08-06 NOTE — Assessment & Plan Note (Signed)
Wt Readings from Last 3 Encounters:  08/05/14 266 lb 12.8 oz (121.02 kg)  02/02/14 253 lb 8 oz (114.987 kg)  12/15/13 250 lb (113.399 kg)   Pt has gained steadily in the last three visits. Discussed diet and exercise. Discussed goals. FU 1 month.

## 2014-08-09 ENCOUNTER — Encounter: Payer: Self-pay | Admitting: Nurse Practitioner

## 2014-08-11 ENCOUNTER — Other Ambulatory Visit: Payer: Self-pay | Admitting: Nurse Practitioner

## 2014-08-11 DIAGNOSIS — E1165 Type 2 diabetes mellitus with hyperglycemia: Secondary | ICD-10-CM

## 2014-08-11 DIAGNOSIS — IMO0002 Reserved for concepts with insufficient information to code with codable children: Secondary | ICD-10-CM

## 2014-08-17 ENCOUNTER — Encounter: Payer: Self-pay | Admitting: Nurse Practitioner

## 2014-08-31 ENCOUNTER — Encounter: Payer: Self-pay | Admitting: Endocrinology

## 2014-08-31 ENCOUNTER — Ambulatory Visit (INDEPENDENT_AMBULATORY_CARE_PROVIDER_SITE_OTHER): Payer: 59 | Admitting: Endocrinology

## 2014-08-31 VITALS — BP 138/86 | HR 108 | Resp 14 | Ht 70.0 in | Wt 266.8 lb

## 2014-08-31 DIAGNOSIS — E119 Type 2 diabetes mellitus without complications: Secondary | ICD-10-CM

## 2014-08-31 DIAGNOSIS — E669 Obesity, unspecified: Secondary | ICD-10-CM

## 2014-08-31 DIAGNOSIS — I10 Essential (primary) hypertension: Secondary | ICD-10-CM

## 2014-08-31 LAB — HM DIABETES FOOT EXAM: HM Diabetic Foot Exam: NORMAL

## 2014-08-31 MED ORDER — GLUMETZA 500 MG PO TB24: 500.0000 mg | ORAL_TABLET | Freq: Two times a day (BID) | ORAL | Status: DC

## 2014-08-31 NOTE — Assessment & Plan Note (Signed)
BP not controlled. Tried Norvasc for 2 days in a row and developed HA. Advised her to try this medication at night time and see whether she tolerates it better. Move Losartan- HCTZ to morning time and assess BP. If not tolerating meds or BP stays uncontrolled, needs follow back with PCP.

## 2014-08-31 NOTE — Assessment & Plan Note (Signed)
Recent A1c trending up. Reviewed goal sugars, A1c and long term complications of DM.  Check sugars 1 x daily and bring meter to next appointment.  Discussed foot care, eye exams and med regimens for DM.  Options discussed were metformin ER versus Glumetza versus SGLT2 versus GLP-1.  She would like to try Glumetza first and will start at 500 mg at night and then increase gradually if tolerated to 500 mg am and 1000 mg at night time. If med not covered then will try ER form.  Continue current Glimeperide. Would not want to increase it due to risk of hypoglycemia and weight gain with med.

## 2014-08-31 NOTE — Progress Notes (Signed)
Pre visit review using our clinic review tool, if applicable. No additional management support is needed unless otherwise documented below in the visit note. 

## 2014-08-31 NOTE — Progress Notes (Signed)
Reason for visit-  Rebecca Hobbs is a 49 y.o.-year-old female, referred by her PCP,  Rebecca Hobbs for management of Type 2 diabetes, uncontrolled, without complications.   HPI- Patient has been diagnosed with diabetes in 2015. Recalls being initially on lifestyle modifications. she has not been on insulin before.   *Didn't tolerate Metformin due to GI upset ( stopped 2015).  Pt is currently on a regimen of: - Amaryl 2mg  daily    Last hemoglobin A1c was: 7.6% 08/06/14 Lab Results  Component Value Date   HGBA1C 6.5* 12/16/2013   HGBA1C 7.8* 08/27/2013     Pt checks her sugars 1-2 a day . Uses  Confirm glucometer. By recall they are:  PREMEAL Breakfast Lunch Dinner Bedtime Overall  Glucose range:     170-200  Mean/median:        POST-MEAL PC Breakfast PC Lunch PC Dinner  Glucose range:     Mean/median:       Hypoglycemia-  No lows. Lowest sugar was n/a; she has hypoglycemia awareness at 70.  Used to have lows post lunch when on metformin and glimeperide together several months back  Dietary habits- eats 2-3 times daily. Skips BF mostly. Tries to limit carbs, sweetened beverages, but not doing so good. Eats out at lunch.   Exercise- walks 30 minutes daily  Weight - up recently Wt Readings from Last 3 Encounters:  08/31/14 266 lb 12 oz (120.997 kg)  08/05/14 266 lb 12.8 oz (121.02 kg)  02/02/14 253 lb 8 oz (114.987 kg)    Diabetes Complications-  Nephropathy- No  CKD, last BUN/creatinine-  Lab Results  Component Value Date   BUN 9 02/03/2014   CREATININE 0.8 02/03/2014   No results found for: GFR   Elevated urine MA 2/16  Retinopathy- No, Last DEE was in August 2015 Neuropathy- no numbness and tingling in )her feet. No known neuropathy.  Associated history - No CAD . No prior stroke. No hypothyroidism. her last TSH was No results found for: TSH  Hyperlipidemia-  her last set of lipids were- Currently on dietary therapy. Tolerating well.   Lab Results   Component Value Date   LDLCALC 100 12/16/2013    Blood Pressure/HTN- Patient's blood pressure is not well controlled today on current regimen that includes ARB. Allergic to lisinopril due to cough. On Losartan and tolerating well. Reports having headache after taking the Norvasc in the morning on 2 days- so hasn't been taking this medication.   Pt has FH of DM in grandparents, both sides.  I have reviewed the patient's past medical history, family and social history, surgical history, medications and allergies.  Past Medical History  Diagnosis Date  . Hypertension     elevated b/p readings x 2 years.  . Frequent headaches   . Pseudotumor cerebri syndrome    Past Surgical History  Procedure Laterality Date  . Laparoscopic total hysterectomy  2012    Cervix intact  . Breast biopsy Left 1992   Family History  Problem Relation Age of Onset  . Hypertension Mother   . Hypertension Father   . COPD Father   . Heart disease Father   . Hypertension Brother   . Diabetes Maternal Grandmother   . Arthritis Maternal Grandfather   . Diabetes Paternal Grandmother   . Diverticulitis Sister   . Heart disease Brother 50    deceased from MI  . Hypertension Brother   . Hypertension Brother   . Breast cancer Maternal Aunt  History   Social History  . Marital Status: Divorced    Spouse Name: N/A  . Number of Children: 1  . Years of Education: 12   Occupational History  . Project [employer]    18 years   Social History Main Topics  . Smoking status: Never Smoker   . Smokeless tobacco: Never Used  . Alcohol Use: Yes     Comment: 1 glass of wine monthly, if any  . Drug Use: No  . Sexual Activity: Yes    Birth Control/ Protection: Surgical   Other Topics Concern  . Not on file   Social History Narrative   Rebecca Hobbs grew up in [place], [place]. She is divorced and has 1 daughter (Rebecca Hobbs age 55) and a grandson Rebecca Hobbs age 75 months). Her daughter and grandson live with her. 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 She makes business cards, church bulletins or anything for special occasions. She attends [employer] [address] and is very active in her church.   Current Outpatient Prescriptions on File Prior to Visit  Medication Sig Dispense Refill  . escitalopram (LEXAPRO) 10 MG tablet Take 1 tablet (10 mg total) by mouth daily. 30 tablet 1  . glimepiride (AMARYL) 2 MG tablet Take 1 tablet (2 mg total) by mouth daily before breakfast. 30 tablet 0  . losartan-hydrochlorothiazide (HYZAAR) 100-12.5 MG per tablet Take 1 tablet by mouth daily. 30 tablet 6  . Multiple Vitamin (MULTIVITAMIN) tablet Take 1 tablet by mouth daily.    Marland Kitchen amLODipine (NORVASC) 5 MG tablet Take 1 tablet (5 mg total) by mouth daily. (Patient not taking: Reported on 08/31/2014) 90 tablet 0   No current facility-administered medications on file prior to visit.   Allergies  Allergen Reactions  . Lisinopril Cough     Review of Systems:  complains of  [  ] denies General:   [ x ] Recent weight change [ x ] Fatigue  [  ] Loss of appetite Eyes: [  ]  Vision Difficulty [  ]  Eye pain ENT: [  ]  Hearing difficulty [  ]  Difficulty Swallowing [facility]: [  ] Chest pain [  ]  Palpitations/Irregular Heart beat [  ]  Shortness of breath lying flat [  ] Swelling of legs Resp: [x  ] Frequent Cough [  ] Shortness of Breath  [  ]  Wheezing GI: [ x ] Heartburn  [  ] Nausea or Vomiting  [  ] Diarrhea [  ] Constipation  [  ] Abdominal Pain GU: [  ]  Polyuria  [  ]  nocturia Bones/joints:  [  ]  Muscle aches  [  ] Joint Pain  [x  ] Bone pain Skin/Hair/Nails: [  ]  Rash  [  ] New stretch marks [  ]  Itching [ x ] Hair loss [  ]  Excessive hair growth Reproduction: [  ] Low sexual desire , [  ]  Women: Menstrual cycle problems [  ]  Women: Breast Discharge [  ] Men: Difficulty with erections [  ]  Men:  Enlarged Breasts CNS: [x  ] Frequent Headaches [  ] Blurry vision [  ] Tremors [  ] Seizures [  ] Loss of consciousness [  ] Localized weakness Endocrine: [  ]  Excess thirst [x  ]  Feeling excessively hot [  ]  Feeling excessively cold Heme: [  ]  Easy bruising [  ]  Enlarged glands or lumps in neck Allergy: [  ]  Food allergies [  ] Environmental allergies  PE: BP 138/86 mmHg  Pulse 108  Resp 14  Ht  (1.778 m)  Wt 266 lb 12 oz (120.997 kg)  BMI 38.27 kg/m2  SpO2 97% Wt Readings from Last 3 Encounters:  08/31/14 266 lb 12 oz (120.997 kg)  08/05/14 266 lb 12.8 oz (121.02 kg)  02/02/14 253 lb 8 oz (114.987 kg)   GENERAL: No acute distress, well developed HEENT:  Eye exam shows normal external appearance. Oral exam shows normal mucosa .  NECK:   Neck exam shows no lymphadenopathy. No Carotids bruits. Thyroid is not enlarged and no nodules felt.  + acanthosis nigricans LUNGS:         Chest is symmetrical. Lungs are clear to auscultation.Marland Kitchen   HEART:         Heart sounds:  S1 and S2 are normal. No murmurs or clicks heard. ABDOMEN:  No Distention present. Liver and spleen are not palpable. No other mass or tenderness present.  EXTREMITIES:     There is no edema. 2+ DP pulses  NEUROLOGICAL:     Grossly intact.            Diabetic foot exam done with shoes and socks removed: Normal Monofilament testing bilaterally. No deformities of toes.  Nails  Not dystrophic. Skin normal color. No open wounds. Dry skin.  MUSCULOSKELETAL:       There is no enlargement or gross deformity of the joints.  SKIN:       No rash  ASSESSMENT AND PLAN: Problem List Items Addressed This Visit      Cardiovascular and Mediastinum   HTN (hypertension)    BP not controlled. Tried Norvasc for 2 days in a row and developed HA. Advised her to try this medication at night time and see whether she tolerates it better. Move Losartan- HCTZ to morning time and assess BP. If not tolerating meds or BP stays uncontrolled,  needs follow back with PCP.         Endocrine   DM type 2 (diabetes mellitus, type 2) - Primary    Recent A1c trending up. Reviewed goal sugars, A1c and long term complications of DM.  Check sugars 1 x daily and bring meter to next appointment.  Discussed foot care, eye exams and med regimens for DM.  Options discussed were metformin ER versus Glumetza versus SGLT2 versus GLP-1.  She would like to try Glumetza first and will start at 500 mg at night and then increase gradually if tolerated to 500 mg am and 1000 mg at night time. If med not covered then will try ER form.  Continue current Glimeperide. Would not want to increase it due to risk of hypoglycemia and weight gain with med.         Relevant Medications   GLUMETZA 500 MG 24 hr tablet     Other   Obesity (BMI 30-39.9)    Encouraged dietary modifications and increase exercise. Don't skip meals. Hopefully with start of Glumetza, her weight will be stabilized.       Relevant Medications   GLUMETZA 500 MG 24 hr tablet        - Return to clinic in 6 weeks with sugar log/meter.  Rayen Dafoe [facility] 08/31/2014 9:23 AM

## 2014-08-31 NOTE — Patient Instructions (Signed)
Check sugars atleast once daily ( please rotate times of the day when you are checking).  Record them in a log book and bring that/meter to next appointment.    Work on reduction in carb content, finding alternative low carb options, increase exercise.   Start Glumetza at 500 mg at night time, then in 1-2 weeks increase it to 500 mg twice daily and in another 2 weeks increase it to 500 mg morning and 1000 mg at night time.   Try moving the lisinopril-HCTZ to morning time and start taking the norvasc at night time to see if your BP is better. If not tolerating still schedule f/u appt with Lyla Sonarrie.  Please come back for a follow-up appointment in 6 weeks

## 2014-08-31 NOTE — Assessment & Plan Note (Signed)
Encouraged dietary modifications and increase exercise. Don't skip meals. Hopefully with start of Glumetza, her weight will be stabilized.

## 2014-09-01 ENCOUNTER — Other Ambulatory Visit: Payer: Self-pay | Admitting: Endocrinology

## 2014-09-01 MED ORDER — METFORMIN HCL ER 500 MG PO TB24: 500.0000 mg | ORAL_TABLET | Freq: Two times a day (BID) | ORAL | Status: DC

## 2014-09-05 ENCOUNTER — Ambulatory Visit: Payer: 59 | Admitting: Nurse Practitioner

## 2014-09-06 ENCOUNTER — Telehealth: Payer: Self-pay

## 2014-09-06 NOTE — Telephone Encounter (Signed)
Called patient 3 times over the past several days. With no response back. Left voicemail on cell phone requesting a callback to confirm patient received new Rx.

## 2014-09-06 NOTE — Telephone Encounter (Signed)
-----   Message from Quentin Cornwalladhika P Phadke, MD sent at 09/01/2014  2:57 PM EST ----- [facility] Please let her know that I think her insurance is wanting us to try metformin Er first prior to trying Glumetza and hence I have sent in the Rx as such. thanks

## 2014-09-08 ENCOUNTER — Encounter: Payer: Self-pay | Admitting: Endocrinology

## 2014-10-12 ENCOUNTER — Other Ambulatory Visit: Payer: Self-pay | Admitting: *Deleted

## 2014-10-12 ENCOUNTER — Other Ambulatory Visit: Payer: Self-pay | Admitting: Nurse Practitioner

## 2014-10-12 ENCOUNTER — Ambulatory Visit (INDEPENDENT_AMBULATORY_CARE_PROVIDER_SITE_OTHER): Payer: 59 | Admitting: Endocrinology

## 2014-10-12 ENCOUNTER — Encounter: Payer: Self-pay | Admitting: Endocrinology

## 2014-10-12 VITALS — BP 132/86 | HR 106 | Temp 98.6°F | Resp 16 | Ht 70.0 in | Wt 267.8 lb

## 2014-10-12 DIAGNOSIS — E669 Obesity, unspecified: Secondary | ICD-10-CM | POA: Diagnosis not present

## 2014-10-12 DIAGNOSIS — E119 Type 2 diabetes mellitus without complications: Secondary | ICD-10-CM | POA: Diagnosis not present

## 2014-10-12 DIAGNOSIS — I10 Essential (primary) hypertension: Secondary | ICD-10-CM

## 2014-10-12 MED ORDER — GLUCOSE BLOOD VI STRP: ORAL_STRIP | Status: DC

## 2014-10-12 MED ORDER — ALBIGLUTIDE 30 MG ~~LOC~~ PEN: 30.0000 mg | PEN_INJECTOR | SUBCUTANEOUS | Status: DC

## 2014-10-12 MED ORDER — LOSARTAN POTASSIUM-HCTZ 100-12.5 MG PO TABS: 1.0000 | ORAL_TABLET | Freq: Every day | ORAL | Status: DC

## 2014-10-12 MED ORDER — GLUMETZA 500 MG PO TB24: 500.0000 mg | ORAL_TABLET | Freq: Two times a day (BID) | ORAL | Status: DC

## 2014-10-12 NOTE — Assessment & Plan Note (Signed)
BP closer to well controlled on current therapy. Continue current therapy for now. Recent urine MA neg 2/16

## 2014-10-12 NOTE — Assessment & Plan Note (Addendum)
Encouraged dietary modifications and increase exercise. Don't skip meals. Hopefully with start of Glumetza, her weight will be stabilized. Starting GLP-1 as well at this visit.

## 2014-10-12 NOTE — Patient Instructions (Signed)
Check sugars atleast once daily ( please rotate times of the day when you are checking).  Record them in a log book and bring that/meter to next appointment.   Stop Amaryl. Start Tanzeum 30 mg [place] weekly . Report back if do not tolerate due to nausea, vomiting, upper abdominal pain that moves to the back.  Report back if not covered under your insurance plan.  One touch verioflex given. Will send in testing supplies.  Start Glumetza at 500 mg at night time, and increase to 500 mg twice daily in about 1-2 weeks as tolerated.   Please come back for a follow-up appointment in 6 weeks

## 2014-10-12 NOTE — Progress Notes (Signed)
Reason for visit-  Rebecca Hobbs is a 49 y.o.-year-old female,  for management of Type 2 diabetes, uncontrolled, without complications. Last visit 6 weeks ago.   HPI- Patient has been diagnosed with diabetes in 2015. Recalls being initially on lifestyle modifications. she has not been on insulin before.   *Didn't tolerate Metformin due to GI upset ( stopped 2015). *Since past visit, tried on Metformin ER and did not tolerate due to GI upset. ( stopped 1 week ago)  Pt is currently on a regimen of: - Amaryl  daily ( has been off for the past month as ran out of med refill)    Last hemoglobin A1c was: 7.6% 08/06/14 Lab Results  Component Value Date   HGBA1C 6.5* 12/16/2013   HGBA1C 7.8* 08/27/2013     Pt checks her sugars 1 every few days . Uses  Confirm glucometer. By review they are:  PREMEAL Breakfast Lunch Dinner Bedtime Overall  Glucose range: 163-284 143     Mean/median:        POST-MEAL PC Breakfast PC Lunch PC Dinner  Glucose range:     Mean/median:       Hypoglycemia-  No lows. Lowest sugar was n/a; she has hypoglycemia awareness at 70.  Used to have lows post lunch when on metformin and glimeperide together several months back  Dietary habits- eats 2-3 times daily. Skips BF mostly. Tries to limit carbs, sweetened beverages, but not doing so good. Eats out at lunch.   Exercise- walks 30 minutes daily - needs to get back to walking Weight - up recently Wt Readings from Last 3 Encounters:  10/12/14 267 lb 12.8 oz (121.473 kg)  08/31/14 266 lb 12 oz (120.997 kg)  08/05/14 266 lb 12.8 oz (121.02 kg)    Diabetes Complications-  Nephropathy- No  CKD, last BUN/creatinine-  Lab Results  Component Value Date   BUN 9 02/03/2014   CREATININE 0.8 02/03/2014   No results found for: GFR   Elevated urine MA 2/16  Retinopathy- No, Last DEE was in August 2015 Neuropathy- no numbness and tingling in )her feet. No known neuropathy.  Associated history - No CAD . No  prior stroke. No hypothyroidism. her last TSH was No results found for: TSH  Hyperlipidemia-  her last set of lipids were- Currently on dietary therapy. Tolerating well.   Lab Results  Component Value Date   LDLCALC 100 12/16/2013    Blood Pressure/HTN- Patient's blood pressure is closer to well controlled today on current regimen that includes ARB. Allergic to lisinopril due to cough. On Losartan and tolerating well. Reports having headache after taking the Norvasc in the morning- now taking it at night time and HAs have resolved.     I have reviewed the patient's past medical history,  medications and allergies.   Current Outpatient Prescriptions on File Prior to Visit  Medication Sig Dispense Refill  . amLODipine (NORVASC) 5 MG tablet Take 1 tablet (5 mg total) by mouth daily. 90 tablet 0  . escitalopram (LEXAPRO) 10 MG tablet Take 1 tablet (10 mg total) by mouth daily. 30 tablet 1  . losartan-hydrochlorothiazide (HYZAAR) 100-12.5 MG per tablet Take 1 tablet by mouth daily. 30 tablet 6  . Multiple Vitamin (MULTIVITAMIN) tablet Take 1 tablet by mouth daily.     No current facility-administered medications on file prior to visit.   Allergies  Allergen Reactions  . Lisinopril Cough    Review of Systems- [ x ]  Complains of    [  ]  denies [ x ] Recent weight change [  ]  Fatigue [  ] polydipsia [  ] polyuria [  ]  nocturia [  ]  vision difficulty [  ] chest pain [  ] shortness of breath [  ] leg swelling [  ] cough [  ] nausea/vomiting [  ] diarrhea [  ] constipation [  ] abdominal pain [  ]  tingling/numbness in extremities [  ]  concern with feet ( wounds/sores)   PE: BP 132/86 mmHg  Pulse 106  Temp(Src) 98.6 F (37 C) (Oral)  Resp 16  Ht 5\' 10"  (1.778 m)  Wt 267 lb 12.8 oz (121.473 kg)  BMI 38.43 kg/m2  SpO2 99% Wt Readings from Last 3 Encounters:  10/12/14 267 lb 12.8 oz (121.473 kg)  08/31/14 266 lb 12 oz (120.997 kg)  08/05/14 266 lb 12.8 oz (121.02 kg)   Exam:  deferred  ASSESSMENT AND PLAN: Problem List Items Addressed This Visit      Cardiovascular and Mediastinum   HTN (hypertension)    BP closer to well controlled on current therapy. Continue current therapy for now. Recent urine MA neg 2/16          Endocrine   DM type 2 (diabetes mellitus, type 2) - Primary    Recent A1c trending up.  Check sugars 1 x daily and bring meter to next appointment. One touch verio flex meter given. She will let me know if this one is covered, so that the testing supplies could be sent in.   Options discussed were Glumetza versus SGLT2 versus GLP-1.  Will like to try Glumetza  and will start at 500 mg at night and then increase gradually if tolerated to 500 mg am and 500 mg at night time. May need PA for this, but has not tolerated 2 forms of metformin recently.  Now, that she is off the Amaryl, we discussed about trying SGLT2, or GLP-1. She has elected the GLP-1 agent. Start Tanzeum 30 mg weekly. Risk profile and side effects discussed. Report back if not tolerated or not covered. Stay off the Glimeperide.           Relevant Medications   GLUMETZA 500 MG 24 hr tablet   Albiglutide (TANZEUM) 30 MG PEN     Other   Obesity (BMI 30-39.9)    Encouraged dietary modifications and increase exercise. Don't skip meals. Hopefully with start of Glumetza, her weight will be stabilized. Starting GLP-1 as well at this visit.         Relevant Medications   GLUMETZA 500 MG 24 hr tablet   Albiglutide (TANZEUM) 30 MG PEN        - Return to clinic in 6 weeks with sugar log/meter.  Daley Mooradian PUSHKAR 10/12/2014 9:00 AM

## 2014-10-12 NOTE — Assessment & Plan Note (Signed)
Recent A1c trending up.  Check sugars 1 x daily and bring meter to next appointment. One touch verio flex meter given. She will let me know if this one is covered, so that the testing supplies could be sent in.   Options discussed were Glumetza versus SGLT2 versus GLP-1.  Will like to try Glumetza  and will start at 500 mg at night and then increase gradually if tolerated to 500 mg am and 500 mg at night time. May need PA for this, but has not tolerated 2 forms of metformin recently.  Now, that she is off the Amaryl, we discussed about trying SGLT2, or GLP-1. She has elected the GLP-1 agent. Start Tanzeum 30 mg weekly. Risk profile and side effects discussed. Report back if not tolerated or not covered. Stay off the Glimeperide.

## 2014-10-18 ENCOUNTER — Encounter: Payer: Self-pay | Admitting: Endocrinology

## 2014-10-19 NOTE — Telephone Encounter (Signed)
PA for Glumetza started and faxed to [facility].

## 2014-10-19 NOTE — Telephone Encounter (Signed)
PA for Tanzem started and faxed to [facility].

## 2014-10-25 ENCOUNTER — Telehealth: Payer: Self-pay

## 2014-10-25 MED ORDER — ALBIGLUTIDE 30 MG ~~LOC~~ PEN: 30.0000 mg | PEN_INJECTOR | SUBCUTANEOUS | Status: DC

## 2014-10-25 NOTE — Telephone Encounter (Signed)
Noted. Details in a different encounter.

## 2014-10-25 NOTE — Telephone Encounter (Signed)
Patient notified of Dr. Ephriam JenkinsPhadke's comments via mychart message.

## 2014-10-25 NOTE — Telephone Encounter (Signed)
Spoke to patient to notify her of PA denial for tanzeum and glumetza. Gave patient the phone number to give the pharmacy to have the discount card processed to use the tanzeum. Patient requested tanzeum Rx be sent to [facility] because she heard that they are more familiar with processing discount card. I informed patient I could do that but she needs to let [facility] know a PA has already been completed and was denied. Also told patient to resume using the metformin ER 1 tab at bedtime. If still can not tolerate after six weeks we will try the PA again for glumetza. Patient verbalized understanding all information given. Rx for tanzeum sent to [facility] pharmacy as requested by patient.

## 2014-10-25 NOTE — Telephone Encounter (Signed)
-----   Message from Quentin Cornwalladhika P Phadke, MD sent at 10/25/2014 10:41 AM EDT ----- Regarding: please call pt regd meds please Please let her know that her PA for Glumetza was denied.  They want us to do a 12 week trial of Metformin ER form. Can she try taking 500 mg of this tablet at night time daily and see how she does on that.  Please also check with her to see if she was able to get the tanzeum.  If not, then we will plan to restart the amaryl and try Bydureon weekly and low dose metformin ER as above daily.   thanks

## 2014-10-25 NOTE — Telephone Encounter (Signed)
-----   Message from Quentin Cornwalladhika P Phadke, MD sent at 10/25/2014 11:40 AM EDT ----- Regarding: RE: please call pt regd meds please Please let her know that it will be okay to start Amaryl 2mg  daily while this is all getting sorted out. She has been without medication for some time now. Please send refills if she needs. If she starts getting lows, then please let me know for further dose adjustments  ----- Message -----    From: Melene Planonika C Abdul-Mutakallim, LPN    Sent: 9/14/78294/26/2016  11:18 AM      To: Quentin Cornwalladhika P Phadke, MD Subject: RE: please call pt regd meds please            Spoke to patient. Details in telephone note. ----- Message -----    From: Quentin Cornwalladhika P Phadke, MD    Sent: 10/25/2014  10:41 AM      To: Marche Hottenstein C Abdul-Mutakallim, LPN Subject: please call pt regd meds please                Please let her know that her PA for Glumetza was denied.  They want us to do a 12 week trial of Metformin ER form. Can she try taking 500 mg of this tablet at night time daily and see how she does on that.  Please also check with her to see if she was able to get the tanzeum.  If not, then we will plan to restart the amaryl and try Bydureon weekly and low dose metformin ER as above daily.   thanks

## 2014-10-31 MED ORDER — GLIMEPIRIDE 2 MG PO TABS: 2.0000 mg | ORAL_TABLET | Freq: Every day | ORAL | Status: DC

## 2014-11-23 ENCOUNTER — Encounter: Payer: Self-pay | Admitting: Endocrinology

## 2014-11-23 ENCOUNTER — Ambulatory Visit (INDEPENDENT_AMBULATORY_CARE_PROVIDER_SITE_OTHER): Payer: 59 | Admitting: Endocrinology

## 2014-11-23 VITALS — BP 122/80 | HR 97 | Resp 12 | Ht 70.0 in | Wt 265.8 lb

## 2014-11-23 DIAGNOSIS — E669 Obesity, unspecified: Secondary | ICD-10-CM | POA: Diagnosis not present

## 2014-11-23 DIAGNOSIS — I10 Essential (primary) hypertension: Secondary | ICD-10-CM | POA: Diagnosis not present

## 2014-11-23 DIAGNOSIS — E119 Type 2 diabetes mellitus without complications: Secondary | ICD-10-CM

## 2014-11-23 NOTE — Assessment & Plan Note (Signed)
Encouraged dietary modifications and increase exercise. Don't skip meals. Seeing appetite reduction and weight loss with GLP-1.

## 2014-11-23 NOTE — Assessment & Plan Note (Signed)
BP  well controlled on current therapy. Continue current therapy for now. Recent urine MA neg 2/16

## 2014-11-23 NOTE — Progress Notes (Signed)
Pre visit review using our clinic review tool, if applicable. No additional management support is needed unless otherwise documented below in the visit note. 

## 2014-11-23 NOTE — Assessment & Plan Note (Signed)
Last A1c trending up. Update next week when fasting- [facility] 1 x daily and bring meter to next appointment.    Her sugars are looking better on current therapy. Continue Amaryl and Tanzeum for now. Check when having symptoms of low- if they are documented- then can try cut back on SU. Asked her not to skip meals and continue with lifestyle efforts. At later visits, she can be [facility], since she has not tolerated the metformin, and ER preps after repeated attempts.

## 2014-11-23 NOTE — Progress Notes (Signed)
Reason for visit-  Rebecca Hobbs is a 48 y.o.-year-old female,  for management of Type 2 diabetes, uncontrolled, without complications. Last visit 6 weeks ago.   HPI- Patient has been diagnosed with diabetes in 2015. Recalls being initially on lifestyle modifications. she has not been on insulin before.   *Didn't tolerate Metformin due to GI upset ( stopped 2015). *Since past visit, tried on Metformin ER and did not tolerate due to GI upset. ( stopped 1 week ago)>>insurance needs longer trial and has tried to take 500 mg ER qhs, develops diarrhea and vomiting after taking it  Pt is currently on a regimen of: - Amaryl  daily ( ran out of RF, we restarted at last visit in April as were having trouble getting the GLP-1/Glumetza) -Tanzeum  weekly ( start April 2016) -Metformin Er 500 mg qhs ( not taking now as does not tolerate)    Last hemoglobin A1c was: 7.6% 08/06/14 Lab Results  Component Value Date   HGBA1C 6.5* 12/16/2013   HGBA1C 7.8* 08/27/2013     Pt checks her sugars 1 every few days . Uses  One Touch verio flex glucometer. By meter download they are:  PREMEAL Breakfast Lunch Dinner Bedtime Overall  Glucose range: 123-154  110-120 153   Mean/median:        POST-MEAL PC Breakfast PC Lunch PC Dinner  Glucose range:     Mean/median:       Hypoglycemia-  No lows. Lowest sugar was n/a; she has hypoglycemia awareness at 70.  Has been symptomatic about 1 weekly when she delays/skips her meals   Dietary habits- eats 2-3 times daily. Skips BF mostly. Tries to limit carbs, sweetened beverages, but not doing so good. Eats out at lunch.   Exercise- walks 30 minutes daily Weight - downrecently Wt Readings from Last 3 Encounters:  11/23/14 265 lb 12.8 oz (120.566 kg)  10/12/14 267 lb 12.8 oz (121.473 kg)  08/31/14 266 lb 12 oz (120.997 kg)    Diabetes Complications-  Nephropathy- No  CKD, last BUN/creatinine-  Lab Results  Component Value Date   BUN 9 02/03/2014    CREATININE 0.8 02/03/2014   No results found for: GFR   Elevated urine MA 2/16  Retinopathy- No, Last DEE was in August 2015 Neuropathy- no numbness and tingling in )her feet. No known neuropathy.  Associated history - No CAD . No prior stroke. No hypothyroidism. her last TSH was No results found for: TSH  Hyperlipidemia-  her last set of lipids were- Currently on dietary therapy. Tolerating well.   Lab Results  Component Value Date   LDLCALC 100 12/16/2013    Blood Pressure/HTN- Patient's blood pressure is well controlled today on current regimen that includes ARB. Allergic to lisinopril due to cough. On Losartan and tolerating well.     I have reviewed the patient's past medical history,  medications and allergies.   Current Outpatient Prescriptions on File Prior to Visit  Medication Sig Dispense Refill  . Albiglutide (TANZEUM) 30 MG PEN Inject 30 mg into the skin once a week. 4 each 3  . amLODipine (NORVASC) 5 MG tablet Take 1 tablet (5 mg total) by mouth daily. 90 tablet 0  . escitalopram (LEXAPRO) 10 MG tablet Take 1 tablet (10 mg total) by mouth daily. 30 tablet 1  . glimepiride (AMARYL) 2 MG tablet Take 1 tablet (2 mg total) by mouth daily before breakfast. 90 tablet 0  . glucose blood test strip Use as instructed 300 each  3  . GLUMETZA 500 MG 24 hr tablet Take 1 tablet (500 mg total) by mouth 2 (two) times daily with a meal. 60 tablet 3  . losartan-hydrochlorothiazide (HYZAAR) 100-12.5 MG per tablet Take 1 tablet by mouth daily. 90 tablet 1  . Multiple Vitamin (MULTIVITAMIN) tablet Take 1 tablet by mouth daily.     No current facility-administered medications on file prior to visit.   Allergies  Allergen Reactions  . Lisinopril Cough    Review of Systems- [ x ]  Complains of    [  ]  denies [  ] Recent weight change [  ]  Fatigue [  ] polydipsia [ x ] polyuria [  ]  nocturia [  ]  vision difficulty [  ] chest pain [  ] shortness of breath [  ] leg swelling [  ]  cough [  ] nausea/vomiting [  ] diarrhea [  ] constipation [  ] abdominal pain [  ]  tingling/numbness in extremities [  ]  concern with feet ( wounds/sores)   PE: BP 122/80 mmHg  Pulse 97  Resp 12  Ht 5\' 10"  (1.778 m)  Wt 265 lb 12.8 oz (120.566 kg)  BMI 38.14 kg/m2 Wt Readings from Last 3 Encounters:  11/23/14 265 lb 12.8 oz (120.566 kg)  10/12/14 267 lb 12.8 oz (121.473 kg)  08/31/14 266 lb 12 oz (120.997 kg)   Exam: deferred  ASSESSMENT AND PLAN: Problem List Items Addressed This Visit      Cardiovascular and Mediastinum   HTN (hypertension)    BP  well controlled on current therapy. Continue current therapy for now. Recent urine MA neg 2/16            Endocrine   DM type 2 (diabetes mellitus, type 2) - Primary    Last A1c trending up. Update next week when fasting- [facility] Check sugars 1 x daily and bring meter to next appointment.    Her sugars are looking better on current therapy. Continue Amaryl and Tanzeum for now. Check when having symptoms of low- if they are documented- then can try cut back on SU. Asked her not to skip meals and continue with lifestyle efforts. At later visits, she can be [facility], since she has not tolerated the metformin, and ER preps after repeated attempts.                  Other   Obesity (BMI 30-39.9)    Encouraged dietary modifications and increase exercise. Don't skip meals. Seeing appetite reduction and weight loss with GLP-1.               - Return to clinic in 3 months with sugar log/meter.  Rebecca Hobbs [facility] 11/23/2014 8:33 AM

## 2014-11-23 NOTE — Patient Instructions (Addendum)
Check sugars atleast once daily ( please rotate times of the day when you are checking).  Record them in a log book and bring that/meter to next appointment.   Also check when experiencing a low. dont skip meals.  Labs when fasting next week.  Please come back for a follow-up appointment in 3 months

## 2014-12-01 LAB — HEMOGLOBIN A1C: Hgb A1c MFr Bld: 7.2 % — AB (ref 4.0–6.0)

## 2014-12-01 LAB — BASIC METABOLIC PANEL
BUN: 9 mg/dL (ref 4–21)
CREATININE: 0.8 mg/dL (ref 0.5–1.1)
Glucose: 120 mg/dL
Potassium: 4 mmol/L (ref 3.4–5.3)
SODIUM: 139 mmol/L (ref 137–147)

## 2014-12-01 LAB — HEPATIC FUNCTION PANEL
ALT: 14 U/L (ref 7–35)
AST: 15 U/L (ref 13–35)
Alkaline Phosphatase: 54 U/L (ref 25–125)
BILIRUBIN, TOTAL: 0.2 mg/dL

## 2014-12-01 LAB — LIPID PANEL
CHOLESTEROL: 175 mg/dL (ref 0–200)
HDL: 51 mg/dL (ref 35–70)
LDL CALC: 99 mg/dL
Triglycerides: 127 mg/dL (ref 40–160)

## 2014-12-05 ENCOUNTER — Encounter: Payer: Self-pay | Admitting: *Deleted

## 2014-12-06 ENCOUNTER — Telehealth: Payer: Self-pay | Admitting: *Deleted

## 2014-12-06 NOTE — Telephone Encounter (Signed)
Sent mychart message with results

## 2014-12-06 NOTE — Telephone Encounter (Signed)
-----   Message from Quentin Cornwalladhika P Phadke, MD sent at 12/06/2014  8:07 AM EDT ----- Regarding: lab results Please could you let her know that her A1c is looking better at 7.2% Normal lytes, CMP  Lipids okay ( TC 175, Tg 127, HDL 51, LDL 99)  Thanks

## 2014-12-19 ENCOUNTER — Other Ambulatory Visit: Payer: Self-pay | Admitting: Nurse Practitioner

## 2015-01-16 ENCOUNTER — Other Ambulatory Visit: Payer: Self-pay | Admitting: Nurse Practitioner

## 2015-01-16 MED ORDER — AMLODIPINE BESYLATE 5 MG PO TABS: 5.0000 mg | ORAL_TABLET | Freq: Every day | ORAL | Status: DC

## 2015-01-16 NOTE — Addendum Note (Signed)
Addended by: Warden FillersWRIGHT, LATOYA S on: 01/16/2015 03:57 PM   Modules accepted: Orders

## 2015-02-13 ENCOUNTER — Other Ambulatory Visit: Payer: Self-pay | Admitting: Endocrinology

## 2015-02-16 ENCOUNTER — Encounter: Payer: Self-pay | Admitting: Nurse Practitioner

## 2015-02-17 ENCOUNTER — Other Ambulatory Visit: Payer: Self-pay | Admitting: Nurse Practitioner

## 2015-02-20 NOTE — Telephone Encounter (Signed)
Pt needs refill on tanzeum call into [facility] on [address]

## 2015-02-21 NOTE — Telephone Encounter (Signed)
Done

## 2015-02-22 ENCOUNTER — Ambulatory Visit: Payer: 59 | Admitting: Endocrinology

## 2015-06-02 ENCOUNTER — Encounter: Payer: Self-pay | Admitting: Nurse Practitioner

## 2015-06-02 ENCOUNTER — Ambulatory Visit (INDEPENDENT_AMBULATORY_CARE_PROVIDER_SITE_OTHER): Payer: 59 | Admitting: Nurse Practitioner

## 2015-06-02 VITALS — BP 120/74 | HR 91 | Temp 97.8°F | Wt 265.0 lb

## 2015-06-02 DIAGNOSIS — E669 Obesity, unspecified: Secondary | ICD-10-CM

## 2015-06-02 DIAGNOSIS — Z23 Encounter for immunization: Secondary | ICD-10-CM | POA: Diagnosis not present

## 2015-06-02 DIAGNOSIS — E119 Type 2 diabetes mellitus without complications: Secondary | ICD-10-CM | POA: Diagnosis not present

## 2015-06-02 NOTE — Patient Instructions (Addendum)
Send me your results via MyChart.   Follow up in January. We will do a Physical ([facility]).   Please get an eye appointment before your physical.   Work on low carb diet.   Happy Holidays!

## 2015-06-02 NOTE — Assessment & Plan Note (Signed)
Non-compliant with medication regimens. She did not bring her [facility] last biometric screening reports. She did bring her meter today. She is not testing daily. Advised her to test at least 1 x daily. Will try Invokana, but after getting results from Labs for GFR.   Pneumovax given today Advised pt to get eye exam Needs Physical exam for Jan.  Gave handout of low carb diet to work on  FU in 6 weeks

## 2015-06-02 NOTE — Progress Notes (Signed)
Patient ID: Rebecca FarrierRhonda Hobbs, female    DOB: 02-11-1966  Age: 49 y.o. MRN: [id number]  CC: Follow-up   HPI Rebecca FarrierRhonda Hobbs presents for follow up of diabetes.    1) 240-250 in the mornings and then lower like 140's during the day Does not check blood sugars daily.   225 yesterday- after lunch 143   7 pm 11/30 243   8 am fasting 11/30  159   11/23 3:39  144   11/20  6 pm   Biometric screening at work in Oct.   Creatinine- elevated she reports  Did not bring report with last labs   2) Metformin- stomach pain all forms Glimepiride- 2 mg tablet does not take daily. Takes prn due to "lows" in the 70's she reports.  Okay to get pneumovax today.    History Rebecca LoserRhonda has a past medical history of Hypertension; Frequent headaches; and Pseudotumor cerebri syndrome.   She has past surgical history that includes Laparoscopic total hysterectomy (2012) and Breast biopsy (Left, 1992).   Her family history includes Arthritis in her maternal grandfather; Breast cancer in her maternal aunt; COPD in her father; Diabetes in her maternal grandmother and paternal grandmother; Diverticulitis in her sister; Heart disease in her father; Heart disease (age of onset: 3150) in her brother; Hypertension in her brother, brother, brother, father, and mother.She reports that she has never smoked. She has never used smokeless tobacco. She reports that she drinks alcohol. She reports that she does not use illicit drugs.  Outpatient Prescriptions Prior to Visit  Medication Sig Dispense Refill  . amLODipine (NORVASC) 5 MG tablet Take 1 tablet (5 mg total) by mouth daily. 90 tablet 1  . escitalopram (LEXAPRO) 10 MG tablet Take 1 tablet (10 mg total) by mouth daily. 30 tablet 1  . glucose blood test strip Use as instructed 300 each 3  . losartan-hydrochlorothiazide (HYZAAR) 100-12.5 MG per tablet Take 1 tablet by mouth  daily 90 tablet 1  . Multiple Vitamin (MULTIVITAMIN) tablet Take 1 tablet by mouth daily.    Marland Kitchen. glimepiride  (AMARYL) 2 MG tablet Take 1 tablet by mouth  daily before breakfast 90 tablet 1  . GLUMETZA 500 MG 24 hr tablet Take 1 tablet (500 mg total) by mouth 2 (two) times daily with a meal. (Patient not taking: Reported on 06/02/2015) 60 tablet 3  . TANZEUM 30 MG PEN INJECT 30 MG INTO THE SKIN ONCE A WEEK. (Patient not taking: Reported on 06/02/2015) 4 each 2   No facility-administered medications prior to visit.    ROS Review of Systems  Constitutional: Negative for fever, chills, diaphoresis and fatigue.  Respiratory: Negative for chest tightness, shortness of breath and wheezing.   Cardiovascular: Negative for chest pain, palpitations and leg swelling.  Gastrointestinal: Negative for nausea, vomiting, diarrhea and rectal pain.  Skin: Negative for rash.  Neurological: Negative for dizziness, weakness, numbness and headaches.  Psychiatric/Behavioral: The patient is not nervous/anxious.     Objective:  BP 120/74 mmHg  Pulse 91  Temp(Src) 97.8 F (36.6 C) (Oral)  Wt 265 lb (120.203 kg)  SpO2 98%  Physical Exam  Constitutional: She is oriented to person, place, and time. She appears well-developed and well-nourished. No distress.  HENT:  Head: Normocephalic and atraumatic.  Right Ear: External ear normal.  Left Ear: External ear normal.  Eyes: Right eye exhibits no discharge. Left eye exhibits no discharge. No scleral icterus.  Glasses  Cardiovascular: Normal rate, regular rhythm and normal heart sounds.  Exam  reveals no gallop and no friction rub.   No murmur heard. Pulmonary/Chest: Effort normal and breath sounds normal. No respiratory distress. She has no wheezes. She has no rales. She exhibits no tenderness.  Neurological: She is alert and oriented to person, place, and time. No cranial nerve deficit. She exhibits normal muscle tone. Coordination normal.  Skin: Skin is warm and dry. No rash noted. She is not diaphoretic.  Psychiatric: She has a normal mood and affect. Her behavior is  normal. Judgment and thought content normal.   Assessment & Plan:   Rebecca Hobbs was seen today for follow-up.  Diagnoses and all orders for this visit:  Type 2 diabetes mellitus without complication, without long-term current use of insulin (HCC)  Obesity (BMI 30-39.9)  I have discontinued [name]'s GLUMETZA, glimepiride, and TANZEUM. I am also having her maintain her multivitamin, escitalopram, glucose blood, amLODipine, and losartan-hydrochlorothiazide.  No orders of the defined types were placed in this encounter.     Follow-up: Return in about 6 weeks (around 07/14/2015) for CPE.

## 2015-06-02 NOTE — Addendum Note (Signed)
Addended byCleda Clarks: RIPLEY, Sadey Yandell B on: 06/02/2015 09:53 AM   Modules accepted: Orders

## 2015-06-02 NOTE — Assessment & Plan Note (Signed)
Asked her to work on low carb diet and exercise. Pt is non-compliant with diabetes medical regimen including diet/exercise/medications/labs. She was advised I cannot help unless she is willing to work with me. She verbalized understanding. Declines referral to Endocrinology at this time.

## 2015-06-06 ENCOUNTER — Encounter: Payer: Self-pay | Admitting: Nurse Practitioner

## 2015-06-15 ENCOUNTER — Other Ambulatory Visit: Payer: Self-pay | Admitting: Nurse Practitioner

## 2015-06-20 NOTE — Telephone Encounter (Signed)
Correspondence sent to patient from Naomie Deanarrie Doss

## 2015-07-19 ENCOUNTER — Encounter: Payer: 59 | Admitting: Nurse Practitioner

## 2015-08-01 ENCOUNTER — Encounter: Payer: Self-pay | Admitting: Nurse Practitioner

## 2015-08-07 ENCOUNTER — Ambulatory Visit (INDEPENDENT_AMBULATORY_CARE_PROVIDER_SITE_OTHER): Payer: 59 | Admitting: Nurse Practitioner

## 2015-08-07 ENCOUNTER — Encounter: Payer: Self-pay | Admitting: Nurse Practitioner

## 2015-08-07 ENCOUNTER — Other Ambulatory Visit: Payer: Self-pay | Admitting: Nurse Practitioner

## 2015-08-07 VITALS — BP 136/78 | HR 94 | Temp 98.3°F | Resp 16 | Ht 70.0 in | Wt 264.8 lb

## 2015-08-07 DIAGNOSIS — F418 Other specified anxiety disorders: Secondary | ICD-10-CM

## 2015-08-07 DIAGNOSIS — I10 Essential (primary) hypertension: Secondary | ICD-10-CM

## 2015-08-07 DIAGNOSIS — R920 Mammographic microcalcification found on diagnostic imaging of breast: Secondary | ICD-10-CM

## 2015-08-07 DIAGNOSIS — Z1239 Encounter for other screening for malignant neoplasm of breast: Secondary | ICD-10-CM | POA: Diagnosis not present

## 2015-08-07 DIAGNOSIS — E669 Obesity, unspecified: Secondary | ICD-10-CM

## 2015-08-07 DIAGNOSIS — Z0001 Encounter for general adult medical examination with abnormal findings: Secondary | ICD-10-CM | POA: Diagnosis not present

## 2015-08-07 DIAGNOSIS — G5621 Lesion of ulnar nerve, right upper limb: Secondary | ICD-10-CM | POA: Diagnosis not present

## 2015-08-07 DIAGNOSIS — Z0000 Encounter for general adult medical examination without abnormal findings: Secondary | ICD-10-CM

## 2015-08-07 DIAGNOSIS — E119 Type 2 diabetes mellitus without complications: Secondary | ICD-10-CM | POA: Insufficient documentation

## 2015-08-07 MED ORDER — AMLODIPINE BESYLATE 5 MG PO TABS: ORAL_TABLET | ORAL | Status: DC

## 2015-08-07 MED ORDER — GLIMEPIRIDE 2 MG PO TABS: 2.0000 mg | ORAL_TABLET | Freq: Every day | ORAL | Status: DC

## 2015-08-07 MED ORDER — ESCITALOPRAM OXALATE 10 MG PO TABS: 10.0000 mg | ORAL_TABLET | Freq: Every day | ORAL | Status: DC

## 2015-08-07 MED ORDER — LOSARTAN POTASSIUM-HCTZ 100-12.5 MG PO TABS: ORAL_TABLET | ORAL | Status: DC

## 2015-08-07 NOTE — Patient Instructions (Addendum)
[facility] at [facility]  Address: [address] [place], [place] [address]  Phone: [phone] Hours:  Monday - Thursday: 8 a.m. - 5 p.m. Friday: 8 a.m. - 3 p.m.  Ulnar Nerve Contusion With Rehab The ulnar nerve lies near the surface of the skin as it passes by the elbow. This location causes it to be susceptible to injury. An ulnar nerve contusion is a bruise of the nerve. It is the result of direct trauma to the elbow. Ulnar nerve contusions are characterized by pain, weakness, and loss of feeling in the hand. SYMPTOMS   Signs of nerve damage include: tingling, numbness, weakness, and/or loss of feeling in the hand, specifically the little finger and ring finger.  Sharp pains that may shoot from the elbow to the wrist and hand.  Decreased hand function.  Tenderness and/ or inflammation in the elbow.  Muscle wasting (atrophy) in the hand. CAUSES  Ulnar nerve contusions are caused by direct trauma to the elbow that results in bleeding which enters the nerve. RISK INCREASES WITH:  Contact sports (football, soccer, or rugby).  Bleeding disorders.  Taking blood thinning medicine (warfarin [Coumadin], aspirin, or nonsteroidal anti-inflammatory medications).  Diabetes mellitus.  Underactive thyroid gland (hypothyroidism). PREVENTION  Wear properly fitted and padded protective equipment.  Only take blood thinning medication when necessary. PROGNOSIS  Ulnar nerve contusions usually heal within 6 weeks. Healing often occurs spontaneously, but treatment helps reduce symptoms.  RELATED COMPLICATIONS   Permanent nerve damage, including pain, numbness, tingling, or weakness in the hand (rare).  Weak grip.  Prolonged healing time, if improperly treated or re-injured. TREATMENT  Treatment initially involves resting from any activities that aggravate the symptoms, and the use of ice and medications to help reduce pain and inflammation. The use of  strengthening and stretching exercises may help reduce pain with activity. These exercises may be performed at home or with referral to a therapist. Your caregiver may recommend that you splint the elbow at night to help healing of the nerve. If symptoms persist despite conservative (non-surgical) treatment, then surgery may be recommended to free the nerve. MEDICATION   If pain medication is necessary, then nonsteroidal anti-inflammatory medications, such as aspirin and ibuprofen, or other minor pain relievers, such as acetaminophen, are often recommended.  Do not take pain medication within 7 days before surgery.  Prescription pain relievers may be given if deemed necessary by your caregiver. Use only as directed and only as much as you need. COLD THERAPY  Cold treatment (icing) relieves pain and reduces inflammation. Cold treatment should be applied for 10 to 15 minutes every 2 to 3 hours for inflammation and pain and immediately after any activity that aggravates your symptoms. Use ice packs or massage the area with a piece of ice (ice massage). SEEK MEDICAL CARE IF:   Treatment seems to offer no benefit, or the condition worsens.  Any medications produce adverse side effects. EXERCISES RANGE OF MOTION (ROM) AND STRETCHING EXERCISES - Ulnar Nerve Contusion These exercises may help you when beginning to rehabilitate your injury. Do not begin these exercises until your physician, physical therapist or athletic trainer advises you to do so. Discontinue any exercise that worsens your symptoms. Contact your physician with instructions on how to continue. Your symptoms may resolve with or without further involvement from your physician, physical therapist or athletic trainer. While completing these exercises, remember:  Restoring tissue flexibility helps normal motion to return to the joints. This allows healthier, less painful  movement and activity.  An effective stretch should be held for at  least 30 seconds.  A stretch should never be painful. You should only feel a gentle lengthening or release in the stretched tissue. RANGE OF MOTION - Extension  Hold your right / left arm at your side and straighten your elbow as far as you can using your right / left arm muscles.  Straighten the right / left elbow farther by gently pushing down on your forearm until you feel a gentle stretch on the inside of your elbow. Hold this position for __________ seconds.  Slowly return to the starting position. Repeat __________ times. Complete this exercise __________ times per day.  RANGE OF MOTION - Flexion  Hold your right / left arm at your side and bend your elbow as far as you can using your right / left arm muscles.  Bend the right / left elbow farther by gently pushing up on your forearm until you feel a gentle stretch on the outside of your elbow. Hold this position for __________ seconds.  Slowly return to the starting position. Repeat __________ times. Complete this exercise __________ times per day.  RANGE OF MOTION - Wrist Flexion, Active-Assisted  Extend your right / left elbow with your fingers pointing down.*  Gently pull the back of your hand towards you until you feel a gentle stretch on the top of your forearm.  Hold this position for __________ seconds. Repeat __________ times. Complete this exercise __________ times per day.  *If directed by your physician, physical therapist or athletic trainer, complete this stretch with your elbow bent rather than extended. RANGE OF MOTION - Wrist Extension, Active-Assisted  Extend your right / left elbow and turn your palm upwards.*  Gently pull your palm/fingertips back so your wrist extends and your fingers point more toward the ground.  You should feel a gentle stretch on the inside of your forearm.  Hold this position for __________ seconds. Repeat __________ times. Complete this exercise __________ times per day. *If  directed by your physician, physical therapist or athletic trainer, complete this stretch with your elbow bent, rather than extended. RANGE OF MOTION - Supination, Active  Stand or sit with your elbows at your side. Bend your right / left elbow to 90 degrees.  Turn your palm upward until you feel a gentle stretch on the inside of your forearm.  Hold this position for __________ seconds. Slowly release and return to the starting position. Repeat __________ times. Complete this stretch __________ times per day.  RANGE OF MOTION - Pronation, Active  Stand or sit with your elbows at your side. Bend your right / left elbow to 90 degrees.  Turn your palm downward until you feel a gentle stretch on the top of your forearm.  Hold this position for __________ seconds. Slowly release and return to the starting position. Repeat __________ times. Complete this stretch __________ times per day.  STRETCH - Wrist Flexion   Place the back of your right / left hand on a tabletop leaving your elbow slightly bent. Your fingers should point away from your body.  Gently press the back of your hand down onto the table by straightening your elbow. You should feel a stretch on the top of your forearm.  Hold this position for __________ seconds. Repeat __________ times. Complete this stretch __________ times per day.  STRETCH - Wrist Extension   Place your right / left fingertips on a tabletop leaving your elbow slightly bent. Your  fingers should point backwards.  Gently press your fingers and palm down onto the table by straightening your elbow. You should feel a stretch on the inside of your forearm.  Hold this position for __________ seconds. Repeat __________ times. Complete this stretch __________ times per day.  STRENGTHENING EXERCISES - Ulnar Nerve Contusion These exercises may help you when beginning to rehabilitate your injury. Do not begin these exercises until your physician, physical therapist  or athletic trainer advises you to do so. Discontinue any exercise that worsens your symptoms. Contact your physician for instructions on how to continue. They may resolve your symptoms with or without further involvement from your physician, physical therapist or athletic trainer. While completing these exercises, remember:   Muscles can gain both the endurance and the strength needed for everyday activities through controlled exercises.  Complete these exercises as instructed by your physician, physical therapist or athletic trainer. Progress with the resistance and repetition exercises only as your caregiver advises. STRENGTH - Wrist Flexors  Sit with your right / left forearm palm-up and fully supported. Your elbow should be resting below the height of your shoulder. Allow your wrist to extend over the edge of the surface.  Loosely holding a __________ weight or a piece of rubber exercise band/tubing, slowly curl your hand up toward your forearm.  Hold this position for __________ seconds. Slowly lower the wrist back to the starting position in a controlled manner. Repeat __________ times. Complete this exercise __________ times per day.  STRENGTH - Wrist Extensors  Sit with your right / left forearm palm-down and fully supported. Your elbow should be resting below the height of your shoulder. Allow your wrist to extend over the edge of the surface.  Loosely holding a __________ weight or a piece of rubber exercise band/tubing, slowly curl your hand up toward your forearm.  Hold this position for __________ seconds. Slowly lower the wrist back to the starting position in a controlled manner. Repeat __________ times. Complete this exercise __________ times per day.  STRENGTH - Ulnar Deviators  Stand with a ____________________ weight in your right / left hand or sit holding on to the rubber exercise band/tubing with your opposite arm supported.  Move your wrist so that your pinkie travels  toward your forearm and your thumb moves away from your forearm.  Hold this position for __________ seconds and then slowly lower the wrist back to the starting position. Repeat __________ times. Complete this exercise __________ times per day STRENGTH - Radial Deviators  Stand with a ____________________ weight in your right / left hand or sit holding on to the rubber exercise band/tubing with your arm supported.  Raise your hand upward in front of you or pull up on the rubber tubing.  Hold this position for __________ seconds and then slowly lower the wrist back to the starting position. Repeat __________ times. Complete this exercise __________ times per day. STRENGTH - Grip  Grasp a tennis ball, a dense sponge, or a large, rolled sock in your hand.  Squeeze as hard as you can without increasing any pain.  Hold this position for __________ seconds. Release your grip slowly. Repeat __________ times. Complete this exercise __________ times per day.    This information is not intended to replace advice given to you by your health care provider. Make sure you discuss any questions you have with your health care provider.   Document Released: 06/17/2005 Document Revised: 11/01/2014 Document Reviewed: 09/29/2008 Elsevier Interactive Patient Education [employer].  Menopause is a normal process in which your reproductive ability comes to an end. This process happens gradually over a span of months to years, usually between the ages of 101 and 25. Menopause is complete when you have missed 12 consecutive menstrual periods. It is important to talk with your health care provider about some of the most common conditions that affect postmenopausal women, such as heart disease, cancer, and bone loss (osteoporosis). Adopting a healthy lifestyle and getting preventive care can help to promote your health and wellness. Those actions can also lower your chances of developing some of these common  conditions. WHAT SHOULD I KNOW ABOUT MENOPAUSE? During menopause, you may experience a number of symptoms, such as:  Moderate-to-severe hot flashes.  Night sweats.  Decrease in sex drive.  Mood swings.  Headaches.  Tiredness.  Irritability.  Memory problems.  Insomnia. Choosing to treat or not to treat menopausal changes is an individual decision that you make with your health care provider. WHAT SHOULD I KNOW ABOUT HORMONE REPLACEMENT THERAPY AND SUPPLEMENTS? Hormone therapy products are effective for treating symptoms that are associated with menopause, such as hot flashes and night sweats. Hormone replacement carries certain risks, especially as you become older. If you are thinking about using estrogen or estrogen with progestin treatments, discuss the benefits and risks with your health care provider. WHAT SHOULD I KNOW ABOUT HEART DISEASE AND STROKE? Heart disease, heart attack, and stroke become more likely as you age. This may be due, in part, to the hormonal changes that your body experiences during menopause. These can affect how your body processes dietary fats, triglycerides, and cholesterol. Heart attack and stroke are both medical emergencies. There are many things that you can do to help prevent heart disease and stroke:  Have your blood pressure checked at least every 1-2 years. High blood pressure causes heart disease and increases the risk of stroke.  If you are 19-71 years old, ask your health care provider if you should take aspirin to prevent a heart attack or a stroke.  Do not use any tobacco products, including cigarettes, chewing tobacco, or electronic cigarettes. If you need help quitting, ask your health care provider.  It is important to eat a healthy diet and maintain a healthy weight.  Be sure to include plenty of vegetables, fruits, low-fat dairy products, and lean protein.  Avoid eating foods that are high in solid fats, added sugars, or salt  (sodium).  Get regular exercise. This is one of the most important things that you can do for your health.  Try to exercise for at least 150 minutes each week. The type of exercise that you do should increase your heart rate and make you sweat. This is known as moderate-intensity exercise.  Try to do strengthening exercises at least twice each week. Do these in addition to the moderate-intensity exercise.  Know your numbers.Ask your health care provider to check your cholesterol and your blood glucose. Continue to have your blood tested as directed by your health care provider. WHAT SHOULD I KNOW ABOUT CANCER SCREENING? There are several types of cancer. Take the following steps to reduce your risk and to catch any cancer development as early as possible. Breast Cancer  Practice breast self-awareness.  This means understanding how your breasts normally appear and feel.  It also means doing regular breast self-exams. Let your health care provider know about any changes, no matter how small.  If you are 76 or older, have a clinician do  a breast exam (clinical breast exam or CBE) every year. Depending on your age, family history, and medical history, it may be recommended that you also have a yearly breast X-ray (mammogram).  If you have a family history of breast cancer, talk with your health care provider about genetic screening.  If you are at high risk for breast cancer, talk with your health care provider about having an MRI and a mammogram every year.  Breast cancer (BRCA) gene test is recommended for women who have family members with BRCA-related cancers. Results of the assessment will determine the need for genetic counseling and BRCA1 and for BRCA2 testing. BRCA-related cancers include these types:  Breast. This occurs in males or females.  Ovarian.  Tubal. This may also be called fallopian tube cancer.  Cancer of the abdominal or pelvic lining (peritoneal  cancer).  Prostate.  Pancreatic. Cervical, Uterine, and Ovarian Cancer Your health care provider may recommend that you be screened regularly for cancer of the pelvic organs. These include your ovaries, uterus, and vagina. This screening involves a pelvic exam, which includes checking for microscopic changes to the surface of your cervix (Pap test).  For women ages 21-65, health care providers may recommend a pelvic exam and a Pap test every three years. For women ages 77-65, they may recommend the Pap test and pelvic exam, combined with testing for human papilloma virus (HPV), every five years. Some types of HPV increase your risk of cervical cancer. Testing for HPV may also be done on women of any age who have unclear Pap test results.  Other health care providers may not recommend any screening for nonpregnant women who are considered low risk for pelvic cancer and have no symptoms. Ask your health care provider if a screening pelvic exam is right for you.  If you have had past treatment for cervical cancer or a condition that could lead to cancer, you need Pap tests and screening for cancer for at least 20 years after your treatment. If Pap tests have been discontinued for you, your risk factors (such as having a new sexual partner) need to be reassessed to determine if you should start having screenings again. Some women have medical problems that increase the chance of getting cervical cancer. In these cases, your health care provider may recommend that you have screening and Pap tests more often.  If you have a family history of uterine cancer or ovarian cancer, talk with your health care provider about genetic screening.  If you have vaginal bleeding after reaching menopause, tell your health care provider.  There are currently no reliable tests available to screen for ovarian cancer. Lung Cancer Lung cancer screening is recommended for adults 64-7 years old who are at high risk for lung  cancer because of a history of smoking. A yearly low-dose CT scan of the lungs is recommended if you:  Currently smoke.  Have a history of at least 30 pack-years of smoking and you currently smoke or have quit within the past 15 years. A pack-year is smoking an average of one pack of cigarettes per day for one year. Yearly screening should:  Continue until it has been 15 years since you quit.  Stop if you develop a health problem that would prevent you from having lung cancer treatment. Colorectal Cancer  This type of cancer can be detected and can often be prevented.  Routine colorectal cancer screening usually begins at age 60 and continues through age 62.  If you  have risk factors for colon cancer, your health care provider may recommend that you be screened at an earlier age.  If you have a family history of colorectal cancer, talk with your health care provider about genetic screening.  Your health care provider may also recommend using home test kits to check for hidden blood in your stool.  A small camera at the end of a tube can be used to examine your colon directly (sigmoidoscopy or colonoscopy). This is done to check for the earliest forms of colorectal cancer.  Direct examination of the colon should be repeated every 5-10 years until age 69. However, if early forms of precancerous polyps or small growths are found or if you have a family history or genetic risk for colorectal cancer, you may need to be screened more often. Skin Cancer  Check your skin from head to toe regularly.  Monitor any moles. Be sure to tell your health care provider:  About any new moles or changes in moles, especially if there is a change in a mole's shape or color.  If you have a mole that is larger than the size of a pencil eraser.  If any of your family members has a history of skin cancer, especially at a young age, talk with your health care provider about genetic screening.  Always use  sunscreen. Apply sunscreen liberally and repeatedly throughout the day.  Whenever you are outside, protect yourself by wearing long sleeves, pants, a wide-brimmed hat, and sunglasses. WHAT SHOULD I KNOW ABOUT OSTEOPOROSIS? Osteoporosis is a condition in which bone destruction happens more quickly than new bone creation. After menopause, you may be at an increased risk for osteoporosis. To help prevent osteoporosis or the bone fractures that can happen because of osteoporosis, the following is recommended:  If you are 60-71 years old, get at least 1,000 mg of calcium and at least 600 mg of vitamin D per day.  If you are older than age 76 but younger than age 58, get at least 1,200 mg of calcium and at least 600 mg of vitamin D per day.  If you are older than age 47, get at least 1,200 mg of calcium and at least 800 mg of vitamin D per day. Smoking and excessive alcohol intake increase the risk of osteoporosis. Eat foods that are rich in calcium and vitamin D, and do weight-bearing exercises several times each week as directed by your health care provider. WHAT SHOULD I KNOW ABOUT HOW MENOPAUSE AFFECTS MY MENTAL HEALTH? Depression may occur at any age, but it is more common as you become older. Common symptoms of depression include:  Low or sad mood.  Changes in sleep patterns.  Changes in appetite or eating patterns.  Feeling an overall lack of motivation or enjoyment of activities that you previously enjoyed.  Frequent crying spells. Talk with your health care provider if you think that you are experiencing depression. WHAT SHOULD I KNOW ABOUT IMMUNIZATIONS? It is important that you get and maintain your immunizations. These include:  Tetanus, diphtheria, and pertussis (Tdap) booster vaccine.  Influenza every year before the flu season begins.  Pneumonia vaccine.  Shingles vaccine. Your health care provider may also recommend other immunizations.   This information is not  intended to replace advice given to you by your health care provider. Make sure you discuss any questions you have with your health care provider.   Document Released: 08/09/2005 Document Revised: 07/08/2014 Document Reviewed: 02/17/2014 Elsevier Interactive Patient Education 2016  [employer].

## 2015-08-07 NOTE — Progress Notes (Signed)
Patient ID: Rebecca Hobbs, female    DOB: [birth year]  Age: 50 y.o. MRN: [id number]  CC: Annual Exam and Elbow Pain   HPI Rebecca Hobbs presents for annual physical with breast exam.   1) Annual Physical   Diet- Eating out at lunch, home the rest of the time   Exercise- 5 days a week, walking, 30-45 min.   Immunizations-    Flu- at [facility]    Tdap-up-to-date  Mammogram- Ordered  Eye Exam- glasses, past due for one, denies concerns   Dental Exam- Not UTD  LMP- hysterectomy  Labs- through [facility]  Fall- Neg.   Depression- Neg.- stable on meds   Refills: Last refill on each medication, through [facility]   2) Acute-  Elbow pain right x 2 months, hurts with lifting heavy items, stiffens at night, tylenol- not helpful. Denies numbness/tingling. Positive for feels weak with lifting, aching, moderate to severe, everyday, intermittent throughout the day and night.   History Rebecca Hobbs has a past medical history of Hypertension; Frequent headaches; and Pseudotumor cerebri syndrome.   Rebecca Hobbs has past surgical history that includes Laparoscopic total hysterectomy (2012) and Breast biopsy (Left, 1992).   Her family history includes Arthritis in her maternal grandfather; Breast cancer in her maternal aunt; COPD in her father; Diabetes in her maternal grandmother and paternal grandmother; Diverticulitis in her sister; Heart disease in her father; Heart disease (age of onset: 61) in her brother; Hypertension in her brother, brother, brother, father, and mother.Rebecca Hobbs reports that Rebecca Hobbs has never smoked. Rebecca Hobbs has never used smokeless tobacco. Rebecca Hobbs reports that Rebecca Hobbs drinks alcohol. Rebecca Hobbs reports that Rebecca Hobbs does not use illicit drugs.  Outpatient Prescriptions Prior to Visit  Medication Sig Dispense Refill  . glucose blood test strip Use as instructed 300 each 3  . Multiple Vitamin (MULTIVITAMIN) tablet Take 1 tablet by mouth daily.    Marland Kitchen amLODipine (NORVASC) 5 MG tablet Take 1 tablet by mouth  daily 90 tablet 4  .  escitalopram (LEXAPRO) 10 MG tablet Take 1 tablet (10 mg total) by mouth daily. 30 tablet 1  . losartan-hydrochlorothiazide (HYZAAR) 100-12.5 MG per tablet Take 1 tablet by mouth  daily 90 tablet 1   No facility-administered medications prior to visit.    ROS Review of Systems  Constitutional: Negative for fever, chills, diaphoresis, fatigue and unexpected weight change.  HENT: Negative for tinnitus and trouble swallowing.   Eyes: Negative for visual disturbance.  Respiratory: Negative for chest tightness, shortness of breath and wheezing.   Cardiovascular: Negative for chest pain, palpitations and leg swelling.  Gastrointestinal: Negative for nausea, vomiting, abdominal pain, diarrhea, constipation and blood in stool.  Endocrine: Negative for polydipsia, polyphagia and polyuria.  Genitourinary: Negative for dysuria, hematuria, vaginal discharge and vaginal pain.  Musculoskeletal: Positive for myalgias and arthralgias. Negative for back pain and gait problem.       Right elbow  Skin: Negative for color change and rash.  Neurological: Negative for dizziness, weakness, numbness and headaches.  Hematological: Does not bruise/bleed easily.  Psychiatric/Behavioral: Negative for suicidal ideas and sleep disturbance. The patient is not nervous/anxious.    Objective:  BP 136/78 mmHg  Pulse 94  Temp(Src) 98.3 F (36.8 C) (Oral)  Resp 16  Ht  (1.778 m)  Wt 264 lb 12.8 oz (120.112 kg)  BMI 37.99 kg/m2  SpO2 98%  Physical Exam  Constitutional: Rebecca Hobbs is oriented to person, place, and time. Rebecca Hobbs appears well-developed and well-nourished. No distress.  HENT:  Head: Normocephalic and atraumatic.  Right Ear: External ear normal.  Left Ear: External ear normal.  Nose: Nose normal.  Mouth/Throat: Oropharynx is clear and moist. No oropharyngeal exudate.  TMs and canals clear bilaterally    Eyes: Conjunctivae and EOM are normal. Pupils are equal, round, and reactive to light. Right eye  exhibits no discharge. Left eye exhibits no discharge. No scleral icterus.  glasses  Neck: Normal range of motion. Neck supple. No thyromegaly present.  Cardiovascular: Normal rate, regular rhythm, normal heart sounds and intact distal pulses.  Exam reveals no gallop and no friction rub.   No murmur heard. Pulmonary/Chest: Effort normal and breath sounds normal. No respiratory distress. Rebecca Hobbs has no wheezes. Rebecca Hobbs has no rales. Rebecca Hobbs exhibits no tenderness.  Breast exam performed today without significant findings- pendulous breasts  Abdominal: Soft. Bowel sounds are normal. Rebecca Hobbs exhibits no distension and no mass. There is no tenderness. There is no rebound and no guarding.  Obese  Genitourinary:  Deferred due to no complaints and pt preference  Musculoskeletal: Normal range of motion. Rebecca Hobbs exhibits no edema or tenderness.  Lymphadenopathy:    Rebecca Hobbs has no cervical adenopathy.  Neurological: Rebecca Hobbs is alert and oriented to person, place, and time. Rebecca Hobbs has normal reflexes. No cranial nerve deficit. Rebecca Hobbs exhibits normal muscle tone. Coordination normal.  Grip strength equal and strong bilaterally, find motor movements intact, negative findings for tinels/phalens of median and ulnar nerves, could not elicit tenderness of the ulnar nerve or surrounding muscles during exam.   Skin: Skin is warm and dry. No rash noted. Rebecca Hobbs is not diaphoretic. No erythema. No pallor.  Psychiatric: Rebecca Hobbs has a normal mood and affect. Her behavior is normal. Judgment and thought content normal.   Assessment & Plan:   Rebecca Hobbs was seen today for annual exam and elbow pain.  Diagnoses and all orders for this visit:  Routine general medical examination at a health care facility  Screening for breast cancer -     MM Digital Screening; Future  Neuritis of right ulnar nerve  Other orders -     amLODipine (NORVASC) 5 MG tablet; Take 1 tablet by mouth  daily -     escitalopram (LEXAPRO) 10 MG tablet; Take 1 tablet (10 mg total) by  mouth daily. -     glimepiride (AMARYL) 2 MG tablet; Take 1 tablet (2 mg total) by mouth daily with breakfast. -     losartan-hydrochlorothiazide (HYZAAR) 100-12.5 MG tablet; Take 1 tablet by mouth  daily   I have changed Rebecca Hobbs's glimepiride and losartan-hydrochlorothiazide. I am also having her maintain her multivitamin, glucose blood, amLODipine, and escitalopram.  Meds ordered this encounter  Medications  . DISCONTD: glimepiride (AMARYL) 2 MG tablet    Sig:   . amLODipine (NORVASC) 5 MG tablet    Sig: Take 1 tablet by mouth  daily    Dispense:  90 tablet    Refill:  3    Order Specific Question:  Supervising Provider    Answer:  Duncan Dull L [[id number]]  . escitalopram (LEXAPRO) 10 MG tablet    Sig: Take 1 tablet (10 mg total) by mouth daily.    Dispense:  90 tablet    Refill:  3    Order Specific Question:  Supervising Provider    Answer:  Duncan Dull L [[id number]]  . glimepiride (AMARYL) 2 MG tablet    Sig: Take 1 tablet (2 mg total) by mouth daily with breakfast.    Dispense:  90 tablet    Refill:  3    Order Specific Question:  Supervising Provider    Answer:  Duncan Dull L [[id number]]  . losartan-hydrochlorothiazide (HYZAAR) 100-12.5 MG tablet    Sig: Take 1 tablet by mouth  daily    Dispense:  90 tablet    Refill:  3    Order Specific Question:  Supervising Provider    Answer:  Sherlene Shams [[id number]]     Follow-up: Return in about 1 year (around 08/06/2016) for CPE .

## 2015-08-07 NOTE — Assessment & Plan Note (Signed)
Lexapro refilled for 1 year. Stable currently on medication

## 2015-08-07 NOTE — Assessment & Plan Note (Addendum)
Discussed acute and chronic issues. Reviewed health maintenance measures, PFSHx, and immunizations. Obtain routine labs TSH, Lipid panel, CBC w/ diff, A1c, and CMET.   Clinical breast exam performed today  Ordered mammogram Deferred pelvic exam per patient request Labs will be obtained through [facility]

## 2015-08-07 NOTE — Assessment & Plan Note (Signed)
Wt Readings from Last 3 Encounters:  08/07/15 264 lb 12.8 oz (120.112 kg)  06/02/15 265 lb (120.203 kg)  11/23/14 265 lb 12.8 oz (120.566 kg)   Stable. Encouraged continued exercise and working on healthy diet and portion control.

## 2015-08-07 NOTE — Assessment & Plan Note (Signed)
BP Readings from Last 3 Encounters:  08/07/15 136/78  06/02/15 120/74  11/23/14 122/80   Stable. Refilled norvasc for 1 yr

## 2015-08-07 NOTE — Assessment & Plan Note (Signed)
New problem  Gave handout and advised to keep arm out of certain positions, use NSAIDs, rest.  Future treatments include medications, PT, and or surgical consult- pt declined these today.  FU prn worsening/failure to improve.

## 2015-08-07 NOTE — Assessment & Plan Note (Addendum)
Checking A1c through [facility] Glimepiride 2 mg once daily filled for 1 yr

## 2015-08-07 NOTE — Assessment & Plan Note (Signed)
Mammogram skipped in 2016.  Screening for breast cancer ordered at [facility] given info to call and set up

## 2015-08-08 LAB — COMPREHENSIVE METABOLIC PANEL
A/G RATIO: 1.2 (ref 1.1–2.5)
ALK PHOS: 59 IU/L (ref 39–117)
ALT: 19 IU/L (ref 0–32)
AST: 20 IU/L (ref 0–40)
Albumin: 4.4 g/dL (ref 3.5–5.5)
BILIRUBIN TOTAL: 0.2 mg/dL (ref 0.0–1.2)
BUN/Creatinine Ratio: 13 (ref 9–23)
BUN: 9 mg/dL (ref 6–24)
CALCIUM: 9.7 mg/dL (ref 8.7–10.2)
CHLORIDE: 98 mmol/L (ref 96–106)
CO2: 23 mmol/L (ref 18–29)
Creatinine, Ser: 0.67 mg/dL (ref 0.57–1.00)
GFR calc Af Amer: 120 mL/min/{1.73_m2} (ref >59)
GFR, EST NON AFRICAN AMERICAN: 104 mL/min/{1.73_m2} (ref >59)
GLUCOSE: 167 mg/dL — AB (ref 65–99)
Globulin, Total: 3.6 g/dL (ref 1.5–4.5)
POTASSIUM: 4.4 mmol/L (ref 3.5–5.2)
Sodium: 139 mmol/L (ref 134–144)
Total Protein: 8 g/dL (ref 6.0–8.5)

## 2015-08-08 LAB — CBC WITH DIFFERENTIAL/PLATELET
BASOS ABS: 0 10*3/uL (ref 0.0–0.2)
Basos: 0 %
EOS (ABSOLUTE): 0.1 10*3/uL (ref 0.0–0.4)
EOS: 1 %
HEMATOCRIT: 40 % (ref 34.0–46.6)
HEMOGLOBIN: 13.2 g/dL (ref 11.1–15.9)
IMMATURE GRANS (ABS): 0 10*3/uL (ref 0.0–0.1)
Immature Granulocytes: 0 %
LYMPHS ABS: 3.2 10*3/uL — AB (ref 0.7–3.1)
LYMPHS: 39 %
MCH: 28.1 pg (ref 26.6–33.0)
MCHC: 33 g/dL (ref 31.5–35.7)
MCV: 85 fL (ref 79–97)
MONOCYTES: 7 %
Monocytes Absolute: 0.5 10*3/uL (ref 0.1–0.9)
NEUTROS ABS: 4.3 10*3/uL (ref 1.4–7.0)
Neutrophils: 53 %
Platelets: 323 10*3/uL (ref 150–379)
RBC: 4.69 x10E6/uL (ref 3.77–5.28)
RDW: 14.5 % (ref 12.3–15.4)
WBC: 8.3 10*3/uL (ref 3.4–10.8)

## 2015-08-08 LAB — LIPID PANEL W/O CHOL/HDL RATIO
CHOLESTEROL TOTAL: 183 mg/dL (ref 100–199)
HDL: 59 mg/dL (ref >39)
LDL Calculated: 100 mg/dL — ABNORMAL HIGH (ref 0–99)
TRIGLYCERIDES: 118 mg/dL (ref 0–149)
VLDL Cholesterol Cal: 24 mg/dL (ref 5–40)

## 2015-08-08 LAB — VITAMIN D 25 HYDROXY (VIT D DEFICIENCY, FRACTURES): Vit D, 25-Hydroxy: 15.9 ng/mL — ABNORMAL LOW (ref 30.0–100.0)

## 2015-08-08 LAB — HGB A1C W/O EAG: Hgb A1c MFr Bld: 8.1 % — ABNORMAL HIGH (ref 4.8–5.6)

## 2015-08-08 LAB — HIV ANTIBODY (ROUTINE TESTING W REFLEX): HIV Screen 4th Generation wRfx: NONREACTIVE

## 2015-08-08 LAB — TSH: TSH: 2.04 u[IU]/mL (ref 0.450–4.500)

## 2015-08-09 ENCOUNTER — Telehealth: Payer: Self-pay | Admitting: Nurse Practitioner

## 2015-08-09 NOTE — Telephone Encounter (Signed)
Mammo order needs to be change to Diagnostic bilateral mammo and needs to have ultrasound orders also/msn

## 2015-08-10 ENCOUNTER — Other Ambulatory Visit: Payer: Self-pay | Admitting: Nurse Practitioner

## 2015-08-10 DIAGNOSIS — R922 Inconclusive mammogram: Secondary | ICD-10-CM

## 2015-08-10 DIAGNOSIS — Z1239 Encounter for other screening for malignant neoplasm of breast: Secondary | ICD-10-CM

## 2015-08-14 ENCOUNTER — Encounter: Payer: Self-pay | Admitting: Nurse Practitioner

## 2015-08-14 ENCOUNTER — Other Ambulatory Visit: Payer: Self-pay | Admitting: Nurse Practitioner

## 2015-08-14 MED ORDER — VITAMIN D (ERGOCALCIFEROL) 1.25 MG (50000 UNIT) PO CAPS: 50000.0000 [IU] | ORAL_CAPSULE | ORAL | Status: DC

## 2015-08-14 MED ORDER — CANAGLIFLOZIN 100 MG PO TABS: 100.0000 mg | ORAL_TABLET | Freq: Every day | ORAL | Status: DC

## 2015-08-15 ENCOUNTER — Other Ambulatory Visit: Payer: Self-pay | Admitting: Nurse Practitioner

## 2015-08-18 ENCOUNTER — Telehealth: Payer: Self-pay

## 2015-08-18 NOTE — Telephone Encounter (Signed)
Invok[employer] PA started, approved through 08/15/2025

## 2016-05-25 LAB — HM DIABETES EYE EXAM

## 2016-06-06 ENCOUNTER — Encounter: Payer: Self-pay | Admitting: Family Medicine

## 2016-08-07 ENCOUNTER — Encounter: Payer: Self-pay | Admitting: Family Medicine

## 2016-08-07 ENCOUNTER — Encounter: Payer: 59 | Admitting: Nurse Practitioner

## 2016-08-07 ENCOUNTER — Ambulatory Visit (INDEPENDENT_AMBULATORY_CARE_PROVIDER_SITE_OTHER): Payer: 59 | Admitting: Family Medicine

## 2016-08-07 VITALS — BP 134/90 | HR 105 | Temp 98.2°F | Wt 263.8 lb

## 2016-08-07 DIAGNOSIS — R000 Tachycardia, unspecified: Secondary | ICD-10-CM

## 2016-08-07 DIAGNOSIS — E1142 Type 2 diabetes mellitus with diabetic polyneuropathy: Secondary | ICD-10-CM

## 2016-08-07 DIAGNOSIS — Z130 Encounter for screening for diseases of the blood and blood-forming organs and certain disorders involving the immune mechanism: Secondary | ICD-10-CM | POA: Diagnosis not present

## 2016-08-07 DIAGNOSIS — Z13220 Encounter for screening for lipoid disorders: Secondary | ICD-10-CM

## 2016-08-07 DIAGNOSIS — Z0001 Encounter for general adult medical examination with abnormal findings: Secondary | ICD-10-CM

## 2016-08-07 DIAGNOSIS — Z87898 Personal history of other specified conditions: Secondary | ICD-10-CM

## 2016-08-07 DIAGNOSIS — Z1329 Encounter for screening for other suspected endocrine disorder: Secondary | ICD-10-CM

## 2016-08-07 DIAGNOSIS — Z1239 Encounter for other screening for malignant neoplasm of breast: Secondary | ICD-10-CM

## 2016-08-07 DIAGNOSIS — R490 Dysphonia: Secondary | ICD-10-CM

## 2016-08-07 DIAGNOSIS — Z1231 Encounter for screening mammogram for malignant neoplasm of breast: Secondary | ICD-10-CM

## 2016-08-07 NOTE — Progress Notes (Signed)
Pre visit review using our clinic review tool, if applicable. No additional management support is needed unless otherwise documented below in the visit note. 

## 2016-08-07 NOTE — Assessment & Plan Note (Signed)
Physical exam completed today. Encouraged continued diet and exercise. Mammography ordered and ultrasounds ordered as well given prior abnormal mammogram. Lab work as outlined below. Pap smear deferred given history of hysterectomy for noncancerous reason. Vaccinations up-to-date. Suspect neuropathy as a cause of burning in her feet and discussed monitoring this. Normal foot exam today. Discussed monitoring her hoarseness and if it does not continue to improve letting us know so she can see ENT.

## 2016-08-07 NOTE — Progress Notes (Signed)
Tommi Rumps, MD Phone: [phone]  Rebecca Hobbs is a 51 y.o. female who presents today for physical exam.  Diet is described as healthy. She eats lots of vegetables. Eats protein with each meal. Not much sweet tea. Staying away from fried foods. Exercises by walking daily. Is not up-to-date on mammograms. Status post hysterectomy for fibroids. Notes they took her cervix. No family history of colon cancer. Vaccinations up-to-date. No tobacco use or illicit drug use. Rare alcohol use. Sees ophthalmology yearly. No dentist.  Patient notes some hoarseness following upper respiratory infection in January. Hoarseness comes and goes. Somewhat improved. Minimal drainage.  Bilateral feet have a burning sensation in the toes for several weeks. No numbness. No tingling. Just a burning sensation. She does have diabetes.  Active Ambulatory Problems    Diagnosis Date Noted  . HTN (hypertension) 07/08/2013  . Screening for breast cancer 07/08/2013  . Depression with anxiety 07/08/2013  . Microcalcifications of the breast 08/20/2013  . DM type 2 (diabetes mellitus, type 2) ([place]) 09/08/2013  . Obesity (BMI 30-39.9) 08/06/2014  . Encounter for general adult medical examination with abnormal findings 08/07/2015  . Neuritis of right ulnar nerve 08/07/2015   Resolved Ambulatory Problems    Diagnosis Date Noted  . Sleep disturbance 07/08/2013  . Need for prophylactic vaccination and inoculation against influenza 07/08/2013  . Routine general medical examination at a health care facility 07/08/2013  . Cough 09/08/2013   Past Medical History:  Diagnosis Date  . Frequent headaches   . Hypertension   . Pseudotumor cerebri syndrome     Family History  Problem Relation Age of Onset  . Hypertension Mother   . Hypertension Father   . COPD Father   . Heart disease Father   . Hypertension Brother   . Diabetes Maternal Grandmother   . Arthritis Maternal Grandfather   . Diabetes Paternal  Grandmother   . Diverticulitis Sister   . Heart disease Brother 66    deceased from MI  . Hypertension Brother   . Hypertension Brother   . Breast cancer Maternal Aunt     Social History   Social History  . Marital status: Divorced    Spouse name: N/A  . Number of children: 1  . Years of education: 61   Occupational History  . [employer]    18 years   Social History Main Topics  . Smoking status: Never Smoker  . Smokeless tobacco: Never Used  . Alcohol use 0.0 oz/week     Comment: 1 glass of wine monthly, if any  . Drug use: No  . Sexual activity: Not Currently    Partners: Male    Birth control/ protection: Surgical     Comment: 1 partner    Other Topics Concern  . Not on file   Social History Narrative   Sondi grew up in [place], [place]. She is divorced and has 1 daughter ([name] age 50) and a grandson ([name] age 5 yo). Her daughter and grandson live in [place]. 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 She loves doing Teacher, early years/pre - doing bulletins, taking pictures. She makes business cards, church bulletins or anything for special occasions. She attends [employer] and is very active in her church.    ROS  General:  Negative for nexplained weight loss, fever Skin: Negative for new or changing mole, sore that won't heal HEENT: Positive for hoarseness, Negative for trouble hearing, trouble seeing, ringing in ears, mouth  sores, change in voice, dysphagia. CV:  Negative for chest pain, dyspnea, edema, palpitations Resp: Negative for cough, dyspnea, hemoptysis GI: Negative for nausea, vomiting, diarrhea, constipation, abdominal pain, melena, hematochezia. GU: Negative for dysuria, incontinence, urinary hesitance, hematuria, vaginal or penile discharge, polyuria, sexual difficulty, lumps in testicle or breasts MSK: Negative for muscle cramps or aches, joint pain or swelling Neuro: Negative for headaches, weakness, numbness, dizziness,  passing out/fainting Psych: Negative for depression, anxiety, memory problems  Objective  Physical Exam Vitals:   08/07/16 0805  BP: 134/90  Pulse: (!) 105  Temp: 98.2 F (36.8 C)    BP Readings from Last 3 Encounters:  08/07/16 134/90  08/07/15 136/78  06/02/15 120/74   Wt Readings from Last 3 Encounters:  08/07/16 263 lb 12.8 oz (119.7 kg)  08/07/15 264 lb 12.8 oz (120.1 kg)  06/02/15 265 lb (120.2 kg)    Physical Exam  Constitutional: No distress.  HENT:  Head: Normocephalic and atraumatic.  Mouth/Throat: Oropharynx is clear and moist. No oropharyngeal exudate.  Eyes: Conjunctivae are normal. Pupils are equal, round, and reactive to light.  Neck: Neck supple.  Cardiovascular: Regular rhythm and normal heart sounds.  Tachycardia present.   Pulmonary/Chest: Effort normal and breath sounds normal.  Abdominal: Soft. Bowel sounds are normal. She exhibits no distension. There is no tenderness. There is no rebound and no guarding.  Genitourinary:  Genitourinary Comments: Mild tenderness right lower outer quadrant of the breast, no mass palpated, no other abnormalities on breast exam, no abnormalities on axillary exam  Musculoskeletal: She exhibits no edema.  Lymphadenopathy:    She has no cervical adenopathy.  Neurological: She is alert. Gait normal.  Skin: Skin is warm and dry. She is not diaphoretic.    Assessment/Plan:   Encounter for general adult medical examination with abnormal findings Physical exam completed today. Encouraged continued diet and exercise. Mammography ordered and ultrasounds ordered as well given prior abnormal mammogram. Lab work as outlined below. Pap smear deferred given history of hysterectomy for noncancerous reason. Vaccinations up-to-date. Suspect neuropathy as a cause of burning in her feet and discussed monitoring this. Normal foot exam today. Discussed monitoring her hoarseness and if it does not continue to improve letting us know so she  can see ENT.   Orders Placed This Encounter  Procedures  . MM Digital Diagnostic Bilat    Standing Status:   Future    Standing Expiration Date:   10/05/2017    Order Specific Question:   Reason for Exam (SYMPTOM  OR DIAGNOSIS REQUIRED)    Answer:   prior microcalcifications on mammo    Order Specific Question:   Is the patient pregnant?    Answer:   No    Order Specific Question:   Preferred imaging location?    Answer:   [facility]  . US BREAST COMPLETE UNI LEFT INC AXILLA    Standing Status:   Future    Standing Expiration Date:   10/05/2017    Order Specific Question:   Reason for Exam (SYMPTOM  OR DIAGNOSIS REQUIRED)    Answer:   prior microcalcifications on mammo    Order Specific Question:   Preferred imaging location?    Answer:   [facility]  . US BREAST COMPLETE UNI RIGHT INC AXILLA    Standing Status:   Future    Standing Expiration Date:   10/05/2017    Order Specific Question:   Reason for Exam (SYMPTOM  OR DIAGNOSIS REQUIRED)    Answer:  prior microcalcifications on mammo    Order Specific Question:   Preferred imaging location?    Answer:   [facility]  . Comp Met (CMET)  . CBC  . Hemoglobin A1c  . TSH  . Lipid panel    No orders of the defined types were placed in this encounter.    Tommi Rumps, MD [place]

## 2016-08-07 NOTE — Patient Instructions (Signed)
Nice to meet you. Please continue to work on diet and exercise. We will get you set up for mammogram. We'll check some lab work today and contact you with the results. Please monitor your hoarseness and if this does not continue to improve please seek medical attention immediately.

## 2016-08-08 LAB — TSH: TSH: 2.35 u[IU]/mL (ref 0.450–4.500)

## 2016-08-08 LAB — COMPREHENSIVE METABOLIC PANEL
ALT: 23 IU/L (ref 0–32)
AST: 21 IU/L (ref 0–40)
Albumin/Globulin Ratio: 1.4 (ref 1.2–2.2)
Albumin: 4.7 g/dL (ref 3.5–5.5)
Alkaline Phosphatase: 75 IU/L (ref 39–117)
BUN / CREAT RATIO: 12 (ref 9–23)
BUN: 10 mg/dL (ref 6–24)
Bilirubin Total: 0.5 mg/dL (ref 0.0–1.2)
CO2: 23 mmol/L (ref 18–29)
CREATININE: 0.86 mg/dL (ref 0.57–1.00)
Calcium: 9.9 mg/dL (ref 8.7–10.2)
Chloride: 98 mmol/L (ref 96–106)
GFR, EST AFRICAN AMERICAN: 92 mL/min/{1.73_m2} (ref >59)
GFR, EST NON AFRICAN AMERICAN: 80 mL/min/{1.73_m2} (ref >59)
Globulin, Total: 3.3 g/dL (ref 1.5–4.5)
Glucose: 308 mg/dL — ABNORMAL HIGH (ref 65–99)
Potassium: 4 mmol/L (ref 3.5–5.2)
Sodium: 140 mmol/L (ref 134–144)
TOTAL PROTEIN: 8 g/dL (ref 6.0–8.5)

## 2016-08-08 LAB — CBC
Hematocrit: 37.4 % (ref 34.0–46.6)
Hemoglobin: 12.3 g/dL (ref 11.1–15.9)
MCH: 28 pg (ref 26.6–33.0)
MCHC: 32.9 g/dL (ref 31.5–35.7)
MCV: 85 fL (ref 79–97)
Platelets: 309 10*3/uL (ref 150–379)
RBC: 4.39 x10E6/uL (ref 3.77–5.28)
RDW: 14.1 % (ref 12.3–15.4)
WBC: 8.4 10*3/uL (ref 3.4–10.8)

## 2016-08-08 LAB — HEMOGLOBIN A1C
Est. average glucose Bld gHb Est-mCnc: 235 mg/dL
Hgb A1c MFr Bld: 9.8 % — ABNORMAL HIGH (ref 4.8–5.6)

## 2016-08-08 LAB — LIPID PANEL
CHOL/HDL RATIO: 3.3 ratio (ref 0.0–4.4)
Cholesterol, Total: 179 mg/dL (ref 100–199)
HDL: 54 mg/dL (ref >39)
LDL Calculated: 102 mg/dL — ABNORMAL HIGH (ref 0–99)
Triglycerides: 117 mg/dL (ref 0–149)
VLDL CHOLESTEROL CAL: 23 mg/dL (ref 5–40)

## 2016-08-09 ENCOUNTER — Other Ambulatory Visit: Payer: Self-pay | Admitting: Family Medicine

## 2016-08-09 DIAGNOSIS — E119 Type 2 diabetes mellitus without complications: Secondary | ICD-10-CM

## 2016-08-09 MED ORDER — ATORVASTATIN CALCIUM 40 MG PO TABS: 40.0000 mg | ORAL_TABLET | Freq: Every day | ORAL | 3 refills | Status: DC

## 2016-08-09 MED ORDER — GLIMEPIRIDE 4 MG PO TABS: 4.0000 mg | ORAL_TABLET | Freq: Every day | ORAL | 3 refills | Status: DC

## 2016-08-22 ENCOUNTER — Other Ambulatory Visit: Payer: Self-pay | Admitting: *Deleted

## 2016-08-22 ENCOUNTER — Inpatient Hospital Stay
Admission: RE | Admit: 2016-08-22 | Discharge: 2016-08-22 | Disposition: A | Discharge status: Home / Self Care | Payer: Self-pay | Source: Ambulatory Visit | Attending: *Deleted | Admitting: *Deleted

## 2016-08-22 DIAGNOSIS — Z9289 Personal history of other medical treatment: Secondary | ICD-10-CM

## 2016-09-06 ENCOUNTER — Encounter: Payer: Self-pay | Admitting: Family Medicine

## 2016-09-06 ENCOUNTER — Ambulatory Visit (INDEPENDENT_AMBULATORY_CARE_PROVIDER_SITE_OTHER): Payer: 59 | Admitting: Family Medicine

## 2016-09-06 ENCOUNTER — Telehealth: Payer: Self-pay

## 2016-09-06 DIAGNOSIS — I10 Essential (primary) hypertension: Secondary | ICD-10-CM | POA: Diagnosis not present

## 2016-09-06 DIAGNOSIS — R1013 Epigastric pain: Secondary | ICD-10-CM | POA: Diagnosis not present

## 2016-09-06 DIAGNOSIS — E119 Type 2 diabetes mellitus without complications: Secondary | ICD-10-CM | POA: Diagnosis not present

## 2016-09-06 MED ORDER — PRAVASTATIN SODIUM 40 MG PO TABS: 40.0000 mg | ORAL_TABLET | Freq: Every day | ORAL | 3 refills | Status: DC

## 2016-09-06 MED ORDER — SITAGLIPTIN PHOSPHATE 100 MG PO TABS: 100.0000 mg | ORAL_TABLET | Freq: Every day | ORAL | 1 refills | Status: DC

## 2016-09-06 NOTE — Assessment & Plan Note (Signed)
At goal. Continue medications. 

## 2016-09-06 NOTE — Progress Notes (Signed)
  Rebecca AlarEric Tharon Bomar, MD Phone: [phone]  Rebecca FarrierRhonda Hobbs is a 51 y.o. female who presents today for f/u.  DIABETES Disease Monitoring: Blood Sugar ranges-310, reports it is slightly improved from previously Polyuria/phagia/dipsia- no      Medications: Compliance- taking glimeperide Hypoglycemic symptoms- no  HYPERTENSION  Disease Monitoring  Home BP Monitoring 120/80s Chest pain- no    Dyspnea- no Medications  Compliance-  Taking norvasc, losartan, HCTZ.  Edema- no  Was started on Lipitor previously for primary prevention of cardiovascular issues. She has had a hysterectomy. She notes right after taking the Lipitor she gets epigastric discomfort that is a dull gnawing sensation. No radiation. No vomiting or diarrhea. Some nausea with it. No blood in her stool. No reflux. Resolves after 3-4 hours and then recurs next time she takes Lipitor.   PMH: nonsmoker   ROS see history of present illness  Objective  Physical Exam Vitals:   09/06/16 0959 09/06/16 1028  BP: 120/86   Pulse: (!) 106 96  Temp: 98.6 F (37 C)     BP Readings from Last 3 Encounters:  09/06/16 120/86  08/07/16 134/90  08/07/15 136/78   Wt Readings from Last 3 Encounters:  09/06/16 263 lb (119.3 kg)  08/07/16 263 lb 12.8 oz (119.7 kg)  08/07/15 264 lb 12.8 oz (120.1 kg)    Physical Exam  Constitutional: She is well-developed, well-nourished, and in no distress.  Cardiovascular: Normal rate, regular rhythm and normal heart sounds.   Pulmonary/Chest: Effort normal and breath sounds normal.  Abdominal: Soft. Bowel sounds are normal. She exhibits no distension. There is no tenderness. There is no rebound and no guarding.  Neurological: She is alert. Gait normal.  Skin: Skin is warm and dry.     Assessment/Plan: Please see individual problem list.  HTN (hypertension) At goal. Continue medications.  DM type 2 (diabetes mellitus, type 2) Still has poor control of her diabetes. Sugars in the 300s.  We will add Januvia. Continue omeprazole. Check glucose today. She will follow up with our pharmacist at her next visit.  Epigastric pain Suspect this is related to the Lipitor as the time course of discomfort fits with taking her Lipitor. Does have a history of gastritis per her report. Benign abdominal exam today. We'll check a CMP. We'll discontinue Lipitor. We'll place on pravastatin for primary cardiac prevention. If she has recurrence of the symptoms with this medication she will let us know.   No orders of the defined types were placed in this encounter.   Meds ordered this encounter  Medications  . pravastatin (PRAVACHOL) 40 MG tablet    Sig: Take 1 tablet (40 mg total) by mouth daily.    Dispense:  90 tablet    Refill:  3  . sitaGLIPtin (JANUVIA) 100 MG tablet    Sig: Take 1 tablet (100 mg total) by mouth daily.    Dispense:  90 tablet    Refill:  1   Rebecca AlarEric Rebecca Sample, MD [facility] [place]

## 2016-09-06 NOTE — Telephone Encounter (Signed)
PA approved from today through 09/06/2017

## 2016-09-06 NOTE — Telephone Encounter (Signed)
PA for Januvia completed on Cover my meds.

## 2016-09-06 NOTE — Assessment & Plan Note (Signed)
Still has poor control of her diabetes. Sugars in the 300s. We will add Januvia. Continue omeprazole. Check glucose today. She will follow up with our pharmacist at her next visit.

## 2016-09-06 NOTE — Assessment & Plan Note (Signed)
Suspect this is related to the Lipitor as the time course of discomfort fits with taking her Lipitor. Does have a history of gastritis per her report. Benign abdominal exam today. We'll check a CMP. We'll discontinue Lipitor. We'll place on pravastatin for primary cardiac prevention. If she has recurrence of the symptoms with this medication she will let us know.

## 2016-09-06 NOTE — Progress Notes (Signed)
Pre visit review using our clinic review tool, if applicable. No additional management support is needed unless otherwise documented below in the visit note. 

## 2016-09-06 NOTE — Addendum Note (Signed)
Addended by: Warden FillersWRIGHT, LATOYA S on: 09/06/2016 10:26 AM   Modules accepted: Orders

## 2016-09-06 NOTE — Addendum Note (Signed)
Addended by: WRIGHT, LATOYA S on: 09/06/2016 10:26 AM   Modules accepted: Orders  

## 2016-09-06 NOTE — Patient Instructions (Signed)
Nice to see you. Please continue to monitor your blood pressure periodically. I would like you to monitor your blood sugars fasting in the morning and at least one other time during the day prior to eating. We will add Januvia. You will continue the glimepiride. Please continue to work on diet and exercise. We will check some lab work today. We are going to discontinue your Lipitor and start you on pravastatin. If this causes upset stomach please let us know.

## 2016-09-07 LAB — COMPREHENSIVE METABOLIC PANEL
ALT: 24 IU/L (ref 0–32)
AST: 20 IU/L (ref 0–40)
Albumin/Globulin Ratio: 1.4 (ref 1.2–2.2)
Albumin: 4.5 g/dL (ref 3.5–5.5)
Alkaline Phosphatase: 72 IU/L (ref 39–117)
BILIRUBIN TOTAL: 0.5 mg/dL (ref 0.0–1.2)
BUN/Creatinine Ratio: 13 (ref 9–23)
BUN: 10 mg/dL (ref 6–24)
CHLORIDE: 96 mmol/L (ref 96–106)
CO2: 25 mmol/L (ref 18–29)
CREATININE: 0.79 mg/dL (ref 0.57–1.00)
Calcium: 9.7 mg/dL (ref 8.7–10.2)
GFR calc non Af Amer: 88 mL/min/{1.73_m2} (ref >59)
GFR, EST AFRICAN AMERICAN: 102 mL/min/{1.73_m2} (ref >59)
GLUCOSE: 254 mg/dL — AB (ref 65–99)
Globulin, Total: 3.2 g/dL (ref 1.5–4.5)
Potassium: 4.5 mmol/L (ref 3.5–5.2)
Sodium: 137 mmol/L (ref 134–144)
TOTAL PROTEIN: 7.7 g/dL (ref 6.0–8.5)

## 2016-09-07 LAB — LDL CHOLESTEROL, DIRECT: LDL DIRECT: 120 mg/dL — AB (ref 0–99)

## 2016-09-09 ENCOUNTER — Other Ambulatory Visit: Payer: Self-pay | Admitting: Nurse Practitioner

## 2016-10-02 ENCOUNTER — Ambulatory Visit
Admission: RE | Admit: 2016-10-02 | Discharge: 2016-10-02 | Disposition: A | Discharge status: Home / Self Care | Payer: 59 | Source: Ambulatory Visit | Attending: Family Medicine | Admitting: Family Medicine

## 2016-10-02 DIAGNOSIS — Z87898 Personal history of other specified conditions: Secondary | ICD-10-CM

## 2016-10-02 DIAGNOSIS — R928 Other abnormal and inconclusive findings on diagnostic imaging of breast: Secondary | ICD-10-CM | POA: Diagnosis not present

## 2016-10-02 DIAGNOSIS — R920 Mammographic microcalcification found on diagnostic imaging of breast: Secondary | ICD-10-CM | POA: Diagnosis present

## 2016-10-02 DIAGNOSIS — R921 Mammographic calcification found on diagnostic imaging of breast: Secondary | ICD-10-CM | POA: Insufficient documentation

## 2016-10-03 ENCOUNTER — Other Ambulatory Visit: Payer: Self-pay | Admitting: Family Medicine

## 2016-10-03 ENCOUNTER — Other Ambulatory Visit: Payer: Self-pay

## 2016-10-03 DIAGNOSIS — R928 Other abnormal and inconclusive findings on diagnostic imaging of breast: Secondary | ICD-10-CM

## 2016-10-03 DIAGNOSIS — R921 Mammographic calcification found on diagnostic imaging of breast: Secondary | ICD-10-CM

## 2016-10-03 DIAGNOSIS — N390 Urinary tract infection, site not specified: Secondary | ICD-10-CM | POA: Diagnosis not present

## 2016-10-07 ENCOUNTER — Ambulatory Visit (INDEPENDENT_AMBULATORY_CARE_PROVIDER_SITE_OTHER): Payer: 59 | Admitting: Pharmacist

## 2016-10-07 ENCOUNTER — Telehealth: Payer: Self-pay

## 2016-10-07 ENCOUNTER — Encounter: Payer: Self-pay | Admitting: Pharmacist

## 2016-10-07 DIAGNOSIS — I10 Essential (primary) hypertension: Secondary | ICD-10-CM | POA: Diagnosis not present

## 2016-10-07 DIAGNOSIS — E119 Type 2 diabetes mellitus without complications: Secondary | ICD-10-CM

## 2016-10-07 MED ORDER — DULAGLUTIDE 0.75 MG/0.5ML ~~LOC~~ SOAJ: 0.7500 mg | SUBCUTANEOUS | 0 refills | Status: DC

## 2016-10-07 MED ORDER — DULAGLUTIDE 0.75 MG/0.5ML ~~LOC~~ SOAJ: 0.7500 mg | SUBCUTANEOUS | 1 refills | Status: DC

## 2016-10-07 NOTE — Assessment & Plan Note (Signed)
Hypertension longstanding diagnosed currently controlled.  Patient reports adherence with medication. Continue current medications. 

## 2016-10-07 NOTE — Assessment & Plan Note (Signed)
Diabetes longstanding diagnosed currently uncontrolled. Patient denies hypoglycemic events and is able to verbalize appropriate hypoglycemia management plan. Patient reports adherence with medication. Control is suboptimal due to insulin resistance and diet. Following discussion and approval by Dr Birdie Sons, the following medication changes were made:  Started Trulicity (dulaglutide) 75 mg weekly. Discussed efficacy and potential side effects.  Samples provided and copay card provided.  Continue glimepiride 4 mg daily and sitaglitin 100 mg daily.   Consider discontinuation of sitagliptin at future visit due to similar mechanism of action with GLP1 agonist.  Counseled on low carbohydrate diet and exercise Instructed patient to continue checking fasting CBG and 2 hours after meals Next A1C anticipated 10/2016.

## 2016-10-07 NOTE — Patient Instructions (Signed)
Thank you for coming in today  Start Trulicity 0.75 mg once weekly  Continue checking blood glucose fasting before breakfast and 2 hours after meals  Followup with Hazle Nordmann, PharmD in 3 weeks

## 2016-10-07 NOTE — Progress Notes (Addendum)
    S:    Chief Complaint  Patient presents with  . Medication Management    Diabetes   Patient arrives in good spirits ambulating without assistance.  Presents for diabetes evaluation, education, and management at the request of Dr Birdie Sons. Patient was referred on 09/06/16.  Patient was last seen by Primary Care Provider on 09/06/16.   Patient reports Diabetes was diagnosed ~2014.   Family/Social History: DM (grandmother), MI (brother)  Patient reports adherence with medications.  Current diabetes medications include: glimepiride 4 mg daily, sitagliptin 100 mg daily Reports she started Januvia 2-3 weeks ago.  She feels like her blood glucose is better since initiation Has previously tried Tanzeum (stopped due to coverage), metformin XR (diarrhea, stomach pain)  Current hypertension medications include: losartant/HCTZ 100/12.5 mg daily, amlodipine 5 mg daily  Patient denies hypoglycemic events. Reports proper treatment knowledge.    Patient reported dietary habits: Eats 2 meals/day Breakfast: usually skips breakfast - possibly pack of crackers.   Lunch:chicken tenders, salad with ranch Dinner:vegetables (cabbage, spinach, asparagus, brussel sprouts, green beans) with chicken or [place] Snacks:rarely Drinks:Water  Patient reported exercise habits: Walking every day (5000-6000 steps)   Patient denies nocturia.  Patient reports neuropathy - burning in toes.  Patient denies visual changes. Patient reports self foot exams. Denies changes.   O:  Physical Exam  Vitals reviewed.  Review of Systems  Constitutional: Negative.      Lab Results  Component Value Date   HGBA1C 9.8 (H) 08/07/2016   Lipid Panel     Component Value Date/Time   CHOL 179 08/07/2016 0836   TRIG 117 08/07/2016 0836   HDL 54 08/07/2016 0836   CHOLHDL 3.3 08/07/2016 0836   LDLCALC 102 (H) 08/07/2016 0836   LDLDIRECT 120 (H) 09/06/2016 1027    Vitals:   10/07/16 0826  BP: 134/86  Pulse: 87     Home fasting CBG: 191, 199, 212, 197, 181, 221, 180, 232, 223, 229, 175, 241  2 hour post-prandial/random CBG: 157, 221, 161 Before Dinner: 121, 142, 160, 173, 176, 126, 223, 197   A/P: Diabetes longstanding diagnosed currently uncontrolled. Patient denies hypoglycemic events and is able to verbalize appropriate hypoglycemia management plan. Patient reports adherence with medication. Control is suboptimal due to insulin resistance and diet. Following discussion and approval by Dr Birdie Sons, the following medication changes were made:  Started Trulicity (dulaglutide) 75 mg weekly. Discussed efficacy and potential side effects.  Samples provided and copay card provided.  Continue glimepiride 4 mg daily and sitaglitin 100 mg daily.   Consider discontinuation of sitagliptin at future visit due to similar mechanism of action with GLP1 agonist.  Counseled on low carbohydrate diet and exercise Instructed patient to continue checking fasting CBG and 2 hours after meals Next A1C anticipated 10/2016.   ASCVD risk greater than 7.5%.  Continued pravastatin 40 mg daily.   Hypertension longstanding diagnosed currently controlled.  Patient reports adherence with medication. Continue current medications.  Written patient instructions provided.  Total time in face to face counseling 50 minutes.   Follow up in Pharmacist Clinic Visit in 3 weeks.    Patient was seen with Dr Birdie Sons today in clinic and medication changes were discussed and approved prior to initiation

## 2016-10-07 NOTE — Telephone Encounter (Signed)
PA for Trulicity completed on Cover My meds

## 2016-10-08 NOTE — Telephone Encounter (Signed)
PA approved from 10/07/2016-10/07/2017.

## 2016-10-11 NOTE — Progress Notes (Signed)
I have reviewed the above note and agree. I saw the patient in the office with the clinical pharmacist.  Eren Ryser, M.D. 

## 2016-10-16 ENCOUNTER — Ambulatory Visit
Admission: RE | Admit: 2016-10-16 | Discharge: 2016-10-16 | Disposition: A | Discharge status: Home / Self Care | Payer: 59 | Source: Ambulatory Visit | Attending: Family Medicine | Admitting: Family Medicine

## 2016-10-16 DIAGNOSIS — R921 Mammographic calcification found on diagnostic imaging of breast: Secondary | ICD-10-CM | POA: Diagnosis not present

## 2016-10-16 DIAGNOSIS — R928 Other abnormal and inconclusive findings on diagnostic imaging of breast: Secondary | ICD-10-CM | POA: Diagnosis present

## 2016-10-16 DIAGNOSIS — R920 Mammographic microcalcification found on diagnostic imaging of breast: Secondary | ICD-10-CM | POA: Diagnosis not present

## 2016-10-16 DIAGNOSIS — N6022 Fibroadenosis of left breast: Secondary | ICD-10-CM | POA: Diagnosis not present

## 2016-10-16 DIAGNOSIS — N6012 Diffuse cystic mastopathy of left breast: Secondary | ICD-10-CM | POA: Diagnosis not present

## 2016-10-16 HISTORY — PX: BREAST BIOPSY: SHX20

## 2016-10-17 LAB — SURGICAL PATHOLOGY

## 2016-10-28 ENCOUNTER — Ambulatory Visit (INDEPENDENT_AMBULATORY_CARE_PROVIDER_SITE_OTHER): Payer: 59 | Admitting: Pharmacist

## 2016-10-28 ENCOUNTER — Encounter: Payer: Self-pay | Admitting: Pharmacist

## 2016-10-28 DIAGNOSIS — I10 Essential (primary) hypertension: Secondary | ICD-10-CM

## 2016-10-28 DIAGNOSIS — E119 Type 2 diabetes mellitus without complications: Secondary | ICD-10-CM | POA: Diagnosis not present

## 2016-10-28 MED ORDER — GLUCOSE BLOOD VI STRP: ORAL_STRIP | 3 refills | Status: DC

## 2016-10-28 MED ORDER — AMLODIPINE BESYLATE 5 MG PO TABS: ORAL_TABLET | ORAL | 3 refills | Status: DC

## 2016-10-28 MED ORDER — SITAGLIPTIN PHOSPHATE 100 MG PO TABS: 100.0000 mg | ORAL_TABLET | Freq: Every day | ORAL | 1 refills | Status: DC

## 2016-10-28 MED ORDER — DULAGLUTIDE 1.5 MG/0.5ML ~~LOC~~ SOAJ: 1.5000 mg | SUBCUTANEOUS | 2 refills | Status: DC

## 2016-10-28 NOTE — Addendum Note (Signed)
Addended by: Hazle Nordmann E on: 10/28/2016 09:00 AM   Modules accepted: Orders

## 2016-10-28 NOTE — Patient Instructions (Signed)
Thank you for coming in today  Continue Trulicity 0.75 mg weekly until you finish your current supply then increase to 1.5 mg weekly   Please call clinic if you any concerns, low blood glucose or more frequent blood glucose in the 200s.    Followup with Hazle Nordmann, PharmD in around 6 weeks.

## 2016-10-28 NOTE — Assessment & Plan Note (Signed)
Diabetes longstanding diagnosed currently uncontrolled but with improved CBGs since starting Trulicity. Patient denies hypoglycemic events and is able to verbalize appropriate hypoglycemia management plan. Patient reports adherence with medication. Control is suboptimal due to insulin resistance and diet. Following discussion and approval by Dr Birdie Sons, the following medication changes were made:  -Increased dose of Trulicity (dulaglutide) to 1.5 mg weekly. Discussed potential adverse effects including nausea.  -Continue glimepiride 4 mg daily and sitaglitin 100 mg daily.   -Consider discontinuation of sitagliptin at future visit due to similar mechanism of action with GLP1 agonist.  -Instructed patient to continue checking fasting CBG and 2 hours after meals.  Instructed to call if she experiences increase nausea, hypoglycemia or frequent CBGs in 200s.   -Refills of Januvia and CBG test strips sent to pharmacy  -Next A1C anticipated 11/2016 (future pending lab ordered).    ASCVD risk greater than 7.5% with LDL above goal at 120 mg/dL. Patient had epigastric pain with atorvastatin 40 mg which resolved upon discontinuation. Continue pravastatin 40 mg daily.  Consider transition to high intensity statin at future visit pending repeat LDL.

## 2016-10-28 NOTE — Assessment & Plan Note (Signed)
Hypertension longstanding diagnosed currently slightly above goal.  Patient reports adherence with medication. Control is suboptimal due to patient being out of amlodipine for 1 week and not having her medications yet today.  Has been at goal at previous visits.  Continue current medications.  Amlodipine refill sent to her pharmacy.

## 2016-10-28 NOTE — Progress Notes (Signed)
I have reviewed the above note and agree. I saw the patient in clinic with the pharmacist.  Nitika Jackowski, M.D.  

## 2016-10-28 NOTE — Progress Notes (Signed)
S:    Chief Complaint  Patient presents with  . Medication Management    Diabetes   Patient arrives in good spirits ambulating without assistance.  Presents for diabetes evaluation, education, and management at the request of Dr Birdie Sons. Patient was referred on 09/06/16.  Patient was last seen by Primary Care Provider on 09/06/16 and last seen in pharmacy clinic on 10/07/16. Today She reports no side effects with the Trulicity currently.  She has not taken her medications today.   Patient reports Diabetes was diagnosed in ~2014.   Family/Social History: DM (grandmother), MI (brother)  Patient reports adherence with medications.  Current diabetes medications include:  Trulicity 0.75 mg weekly (3 pens remaining), glimepiride 4 mg daily, Januvia 100 mg daily Current hypertension medications include: amlodipine 5 mg daily (out of for 1 week - needs refills), lisinopril/HCTZ 100/12.5 mg daily  Switched from atorvastatin 40 mg on 09/06/16 secondary to epigastric pain associated with atorvastatin.  She reports this has resolved with pravastatin.   Patient denies hypoglycemic events. Patient reported dietary habits: States she is trying to eat healthy as much as possible  Patient reported exercise habits: Increased walking slightly (6000 steps every day)   Patient reports nocturia 2x/night.  Patient reports neuropathy in toes of left foot (she believes has improved) Patient denies visual changes. Patient reports self foot exams. Denies changes.   Depression: Reports she has not taken Lexapro in several months.  Reports mood much better since stopping Lexapro  O:  Physical Exam  Vitals reviewed.  Review of Systems  Constitutional: Negative.    Lab Results  Component Value Date   HGBA1C 9.8 (H) 08/07/2016   Lipid Panel     Component Value Date/Time   CHOL 179 08/07/2016 0836   TRIG 117 08/07/2016 0836   HDL 54 08/07/2016 0836   CHOLHDL 3.3 08/07/2016 0836   LDLCALC 102 (H)  08/07/2016 0836   LDLDIRECT 120 (H) 09/06/2016 1027   Vitals:   10/28/16 0818  BP: (!) 141/94  Pulse: 86   Home fasting CBG: 96, 113, 124, 137, 177, 157, 139, 228, 197, 196 2 hour post-prandial: 135, 83, 107, 196, 186, 154, 152, 117, 105, 206  A/P: Diabetes longstanding diagnosed currently uncontrolled but with improved CBGs since starting Trulicity. Patient denies hypoglycemic events and is able to verbalize appropriate hypoglycemia management plan. Patient reports adherence with medication. Control is suboptimal due to insulin resistance and diet. Following discussion and approval by Dr Birdie Sons, the following medication changes were made:  -Increased dose of Trulicity (dulaglutide) to 1.5 mg weekly. Discussed potential adverse effects including nausea.  -Continue glimepiride 4 mg daily and sitaglitin 100 mg daily.   -Consider discontinuation of sitagliptin at future visit due to similar mechanism of action with GLP1 agonist.  -Instructed patient to continue checking fasting CBG and 2 hours after meals.  Instructed to call if she experiences increase nausea, hypoglycemia or frequent CBGs in 200s.   -Refills of Januvia and CBG test strips sent to pharmacy  -Next A1C anticipated 11/2016 (future pending lab ordered).    ASCVD risk greater than 7.5% with LDL above goal at 120 mg/dL. Patient had epigastric pain with atorvastatin 40 mg which resolved upon discontinuation. Continue pravastatin 40 mg daily.  Consider transition to high intensity statin at future visit pending repeat LDL.   Hypertension longstanding diagnosed currently slightly above goal.  Patient reports adherence with medication. Control is suboptimal due to patient being out of amlodipine for 1 week and  not having her medications yet today.  Has been at goal at previous visits.  Continue current medications.  Amlodipine refill sent to her pharmacy.   Written patient instructions provided.  Total time in face to face counseling  35 minutes.   Follow up in Pharmacist Clinic Visit in 6 weeks.    Patient was seen with Dr Birdie Sons today in clinic and medication changes were discussed and approved prior to initiation

## 2016-11-15 ENCOUNTER — Other Ambulatory Visit: Payer: Self-pay

## 2016-11-15 MED ORDER — PRAVASTATIN SODIUM 40 MG PO TABS: 40.0000 mg | ORAL_TABLET | Freq: Every day | ORAL | 3 refills | Status: DC

## 2016-12-16 ENCOUNTER — Encounter: Payer: Self-pay | Admitting: Pharmacist

## 2016-12-16 ENCOUNTER — Ambulatory Visit (INDEPENDENT_AMBULATORY_CARE_PROVIDER_SITE_OTHER): Payer: 59 | Admitting: Pharmacist

## 2016-12-16 VITALS — BP 130/84 | HR 97 | Wt 255.6 lb

## 2016-12-16 DIAGNOSIS — E119 Type 2 diabetes mellitus without complications: Secondary | ICD-10-CM

## 2016-12-16 DIAGNOSIS — I10 Essential (primary) hypertension: Secondary | ICD-10-CM | POA: Diagnosis not present

## 2016-12-16 NOTE — Addendum Note (Signed)
Addended by: Penne LashWIGGINS, Tristram Milian N on: 12/16/2016 08:35 AM   Modules accepted: Orders

## 2016-12-16 NOTE — Progress Notes (Signed)
    S:     Chief Complaint  Patient presents with  . Medication Management    Diabetes   Patient arrives in good spirits.  Presents for diabetes evaluation, education, and management at the request of Dr Birdie SonsSonnenberg. Patient was referred on 09/06/16.  Patient was last seen by Primary Care Provider on 09/06/16 and last seen in pharmacy clinic on 10/28/16. Today patient reports continued diet and exercise changes and reports her blood glucose values have continued to improve.   Patient reports adherence with medications.  Current diabetes medications include: trulicity 1.5 mg daily, glimepiride 4 mg daily, Januvia 100 mg daily  Previous diabetes medications: Metformin (diarrhea and stomach pain), Invokana (rash) Current hypertension medications include: Losartan/HCTZ 100/12.5 mg daily   Patient denies hypoglycemic events.  Patient reported dietary habits: Reports diet has been going much better.  Eating more vegetables and salads. Eating much less than she was.  Avoiding soft drinks/sweet tea.    Patient reported exercise habits: Walking ~6000 steps/day.  Mostly walking.     Patient reports nocturia 1x/night.  Patient reports neuropathy in her hands and feet (they go to sleep a lot more recently) Patient denies visual changes. Patient reports self foot exams. Denies changes  O:  Physical Exam  Vitals reviewed.  Review of Systems  Constitutional: Negative.      Lab Results  Component Value Date   HGBA1C 6.5 (H) 12/16/2016   Vitals:   12/16/16 0807  BP: 130/84  Pulse: 97   CBGs (past 7 days): 144, 126, 103, 95, 95, 115, 181, 71, 93 mg/dL   A/P: Diabetes longstanding diagnosed currently at goal. Patient denies hypoglycemic events and is able to verbalize appropriate hypoglycemia management plan. Patient reports adherence with medication.  Following discussion and approval by Dr Birdie SonsSonnenberg, the following medication changes were made:  -A1C and microalbumin/SCr ordered  today -Continue Trulicity 1.5 mg weekly -Continue glimepiride 4 mg daily -Continue sitagliptin 100 mg daily -Discussed signs/symptoms/treatment of hypoglycemia.  -Next A1C anticipated 3 months.   -Consider discontinuation of sitagliptin at future visit due to similar mechanism of action with GLP1 agonist (especially if decreased tolerability to regimen)  ASCVD risk greater than 7.5% with LDL at goal. Patient had epigastric pain with atorvastatin 40 mg which resolved upon discontinuation. Continue pravastatin 40 mg daily. Patient not currently on Aspirin given age  Hypertension longstanding diagnosed currently controlled.  Patient reports adherence with medication. Continue current medications.  Written patient instructions provided.  Total time in face to face counseling 45 minutes.   Follow up with Dr Birdie SonsSonnenberg in August.     Patient was seen with Dr Birdie SonsSonnenberg today in clinic and medication changes were discussed and approved prior to initiation

## 2016-12-16 NOTE — Patient Instructions (Signed)
Thank you for coming in today  Continue current medications.  Keep up the good work  We will call you with your A1C and cholesterol results

## 2016-12-17 LAB — LDL CHOLESTEROL, DIRECT: LDL DIRECT: 48 mg/dL (ref 0–99)

## 2016-12-17 LAB — HEMOGLOBIN A1C
Est. average glucose Bld gHb Est-mCnc: 140 mg/dL
HEMOGLOBIN A1C: 6.5 % — AB (ref 4.8–5.6)

## 2016-12-17 LAB — MICROALBUMIN / CREATININE URINE RATIO
Creatinine, Urine: 261.3 mg/dL
MICROALBUM., U, RANDOM: 13.2 ug/mL
Microalb/Creat Ratio: 5.1 mg/g creat (ref 0.0–30.0)

## 2016-12-17 NOTE — Assessment & Plan Note (Signed)
Hypertension longstanding diagnosed currently controlled.  Patient reports adherence with medication. Continue current medications. 

## 2016-12-17 NOTE — Assessment & Plan Note (Addendum)
Diabetes longstanding diagnosed currently at goal. Patient denies hypoglycemic events and is able to verbalize appropriate hypoglycemia management plan. Patient reports adherence with medication.  Following discussion and approval by Dr Birdie SonsSonnenberg, the following medication changes were made:  -A1C and microalbumin/SCr ordered today -Continue Trulicity 1.5 mg weekly -Continue glimepiride 4 mg daily -Continue sitagliptin 100 mg daily -Discussed signs/symptoms/treatment of hypoglycemia.  -Next A1C anticipated 3 months.   -Consider discontinuation of sitagliptin at future visit due to similar mechanism of action with GLP1 agonist (especially if decreased tolerability to regimen)  ASCVD risk greater than 7.5% with LDL at goal. Patient had epigastric pain with atorvastatin 40 mg which resolved upon discontinuation. Continue pravastatin 40 mg daily. Patient not currently on Aspirin given age

## 2016-12-18 NOTE — Progress Notes (Signed)
I have reviewed the above note and agree. I saw the patient with the pharmacist.  Hicks Feick, M.D.  

## 2017-02-04 ENCOUNTER — Encounter: Payer: Self-pay | Admitting: Family Medicine

## 2017-02-04 ENCOUNTER — Ambulatory Visit (INDEPENDENT_AMBULATORY_CARE_PROVIDER_SITE_OTHER): Payer: 59 | Admitting: Family Medicine

## 2017-02-04 VITALS — BP 120/80 | HR 94 | Temp 98.1°F | Wt 249.2 lb

## 2017-02-04 DIAGNOSIS — I10 Essential (primary) hypertension: Secondary | ICD-10-CM

## 2017-02-04 DIAGNOSIS — E119 Type 2 diabetes mellitus without complications: Secondary | ICD-10-CM | POA: Diagnosis not present

## 2017-02-04 DIAGNOSIS — L729 Follicular cyst of the skin and subcutaneous tissue, unspecified: Secondary | ICD-10-CM | POA: Diagnosis not present

## 2017-02-04 NOTE — Patient Instructions (Signed)
Nice to see you. We'll discontinue the Januvia. Please continue to monitor blood sugars and if they start to increase after discontinuing this please let us know. Please monitor your urination and if it increases even more please let us know. We will get you to see dermatology for the cyst.

## 2017-02-04 NOTE — Progress Notes (Signed)
  Marikay AlarEric Kammi Hechler, MD Phone: [phone]  Rebecca FarrierRhonda Hobbs is a 51 y.o. female who presents today for f/u  DIABETES Disease Monitoring: Blood Sugar ranges-120 Polyuria/phagia/dipsia- some polyuria though this started after she increased her water intake      Optho- UTD Medications: Compliance- taking trulicity, glimeperide, januvia Hypoglycemic symptoms- no  HYPERTENSION  Disease Monitoring  Home BP Monitoring similar to today Chest pain- no    Dyspnea- no Medications  Compliance-  Taking amlodipine, losartan, HCTZ.   Edema- no  Skin cyst: Notes this has been present for years. She saw dermatology in the past and they advised to watch it wasn't bothering her. It intermittently drains smelling material. No pain. No fevers. No erythema. She started draining yesterday. No drainage now.   PMH: nonsmoker.   ROS see history of present illness  Objective  Physical Exam Vitals:   02/04/17 0802  BP: 120/80  Pulse: 94  Temp: 98.1 F (36.7 C)    BP Readings from Last 3 Encounters:  02/04/17 120/80  12/16/16 130/84  10/28/16 (!) 141/94   Wt Readings from Last 3 Encounters:  02/04/17 249 lb 3.2 oz (113 kg)  12/16/16 255 lb 9.6 oz (115.9 kg)  10/28/16 259 lb 3.2 oz (117.6 kg)    Physical Exam  Constitutional: No distress.  Cardiovascular: Normal rate, regular rhythm and normal heart sounds.   Pulmonary/Chest: Effort normal and breath sounds normal.  Musculoskeletal: She exhibits no edema.  Neurological: She is alert. Gait normal.  Skin: She is not diaphoretic.        Assessment/Plan: Please see individual problem list.  HTN (hypertension) At goal. Continue current medications.  DM type 2 (diabetes mellitus, type 2) Reports most recent A1c 6.0 through work. Discussed discontinuing Januvia given improvement in A1c. She'll continue Trulicity and glimepiride. Suspect the increased urination is related to her increased water intake. She'll monitor this. She noted no  dysuria or urgency.  Cutaneous cyst No signs of infection at this time. Advised seeing dermatology to consider removal. Given infection return precautions.   Orders Placed This Encounter  Procedures  . Ambulatory referral to Dermatology    Referral Priority:   Routine    Referral Type:   Consultation    Referral Reason:   Specialty Services Required    Requested Specialty:   Dermatology    Number of Visits Requested:   1   Maintenance: Patient just turned 51. She is due for colon cancer screening. Had a discussion regarding colonoscopy versus cologuard. Patient opted for cologuard. The form will be faxed. She was advised to contact her insurance company to ensure that they cover this. If they do not cover cologuard she was advised not to provide a sample and to send the box back without a sample in it. She will let us know if her insurance company does not cover cologuard so we can get her set up for colonoscopy.  Marikay AlarEric Yazmina Pareja, MD [facility] [place]

## 2017-02-04 NOTE — Assessment & Plan Note (Signed)
At goal. Continue current medications. 

## 2017-02-04 NOTE — Assessment & Plan Note (Signed)
Reports most recent A1c 6.0 through work. Discussed discontinuing Januvia given improvement in A1c. She'll continue Trulicity and glimepiride. Suspect the increased urination is related to her increased water intake. She'll monitor this. She noted no dysuria or urgency.

## 2017-02-04 NOTE — Assessment & Plan Note (Signed)
No signs of infection at this time. Advised seeing dermatology to consider removal. Given infection return precautions.

## 2017-03-04 DIAGNOSIS — L729 Follicular cyst of the skin and subcutaneous tissue, unspecified: Secondary | ICD-10-CM | POA: Diagnosis not present

## 2017-03-17 DIAGNOSIS — Z1211 Encounter for screening for malignant neoplasm of colon: Secondary | ICD-10-CM | POA: Diagnosis not present

## 2017-03-17 DIAGNOSIS — Z1212 Encounter for screening for malignant neoplasm of rectum: Secondary | ICD-10-CM | POA: Diagnosis not present

## 2017-03-21 LAB — COLOGUARD: COLOGUARD: NEGATIVE

## 2017-03-24 ENCOUNTER — Encounter: Payer: Self-pay | Admitting: Family Medicine

## 2017-03-24 DIAGNOSIS — L720 Epidermal cyst: Secondary | ICD-10-CM | POA: Diagnosis not present

## 2017-04-06 ENCOUNTER — Other Ambulatory Visit: Payer: Self-pay | Admitting: Family Medicine

## 2017-04-15 ENCOUNTER — Ambulatory Visit (INDEPENDENT_AMBULATORY_CARE_PROVIDER_SITE_OTHER): Payer: 59 | Admitting: Family Medicine

## 2017-04-15 ENCOUNTER — Encounter: Payer: Self-pay | Admitting: Family Medicine

## 2017-04-15 VITALS — BP 132/78 | HR 97 | Temp 98.1°F | Resp 16 | Wt 251.0 lb

## 2017-04-15 DIAGNOSIS — I10 Essential (primary) hypertension: Secondary | ICD-10-CM

## 2017-04-15 DIAGNOSIS — E119 Type 2 diabetes mellitus without complications: Secondary | ICD-10-CM | POA: Diagnosis not present

## 2017-04-15 DIAGNOSIS — R920 Mammographic microcalcification found on diagnostic imaging of breast: Secondary | ICD-10-CM | POA: Diagnosis not present

## 2017-04-15 DIAGNOSIS — Z23 Encounter for immunization: Secondary | ICD-10-CM | POA: Diagnosis not present

## 2017-04-15 DIAGNOSIS — R232 Flushing: Secondary | ICD-10-CM | POA: Diagnosis not present

## 2017-04-15 DIAGNOSIS — B359 Dermatophytosis, unspecified: Secondary | ICD-10-CM

## 2017-04-15 DIAGNOSIS — F418 Other specified anxiety disorders: Secondary | ICD-10-CM

## 2017-04-15 MED ORDER — KETOCONAZOLE 2 % EX CREA: 1.0000 "application " | TOPICAL_CREAM | Freq: Every day | CUTANEOUS | 0 refills | Status: DC

## 2017-04-15 NOTE — Patient Instructions (Signed)
Nice to see you. We'll treat with ketoconazole for the ringworm. We'll check some lab work to evaluate her hot flashes and then determine the next step in management. We'll check an A1c.

## 2017-04-15 NOTE — Assessment & Plan Note (Signed)
Rash consistent with ringworm. Treat with topical medication.

## 2017-04-15 NOTE — Assessment & Plan Note (Addendum)
History consistent with hot flashes. Suspect related to menopause. Suspect heart rate elevated due to hot flash while she was in the office. Return to normal. We will check lab work as outlined below. Discussed treatment options. We'll determine treatment after labs return.

## 2017-04-15 NOTE — Assessment & Plan Note (Signed)
Well-controlled.  Continue current regimen. 

## 2017-04-15 NOTE — Assessment & Plan Note (Signed)
Significant improved. Continue current regimen.

## 2017-04-15 NOTE — Assessment & Plan Note (Signed)
Asymptomatic. Will resolve this issue.

## 2017-04-15 NOTE — Assessment & Plan Note (Signed)
Discussed need for follow-up mammogram. Mammogram ordered.

## 2017-04-15 NOTE — Progress Notes (Signed)
Tommi Rumps, MD Phone: [phone]  Rebecca Hobbs is a 51 y.o. female who presents today for f/u.  DIABETES Disease Monitoring: Blood Sugar ranges-130-140, excursion to 170 Polyuria/phagia/dipsia- no       Medications: Compliance- taking trulicity, glimeperide Hypoglycemic symptoms- single episode to 58, ate and improved  HYPERTENSION  Disease Monitoring  Home BP Monitoring 120s over 80s Chest pain- no    Dyspnea- no Medications  Compliance-  taking amlodipine, losartan, HCTZ.  Edema- no  Denies anxiety and depression.  Note some hot flashes. She notes daily over the last several weeks she'll feel warm centrally that will work its way out and then she'll break into a sweat. Occurs about every hour. Last for about a minute. Previously was having rare hot flashes. She's status post hysterectomy area does still have her ovaries.  Rash on right forearm is been there for a week or so. Looked like ringworm. It was itching. She has tumor again at low. It is getting better.  Patient due for mammogram.  PMH: nonsmoker.   ROS see history of present illness  Objective  Physical Exam Vitals:   04/15/17 0807 04/15/17 0828  BP: 132/78   Pulse: (!) 111 97  Resp: 16   Temp: 98.1 F (36.7 C)   SpO2: 96%     BP Readings from Last 3 Encounters:  04/15/17 132/78  02/04/17 120/80  12/16/16 130/84   Wt Readings from Last 3 Encounters:  04/15/17 251 lb (113.9 kg)  02/04/17 249 lb 3.2 oz (113 kg)  12/16/16 255 lb 9.6 oz (115.9 kg)    Physical Exam  Constitutional: No distress.  Cardiovascular: Normal heart sounds.   Slightly tachycardic, regular rhythm  Pulmonary/Chest: Effort normal and breath sounds normal.  Musculoskeletal: She exhibits no edema.  Neurological: She is alert. Gait normal.  Skin: Skin is warm and dry. She is not diaphoretic.   right dorsal forearm with circular annular lesion with flaking and central clearing   Assessment/Plan: Please see individual  problem list.  HTN (hypertension) Well-controlled. Continue current regimen.  DM type 2 (diabetes mellitus, type 2) Significant improved. Continue current regimen.  Depression with anxiety Asymptomatic. Will resolve this issue.  Microcalcifications of the breast Discussed need for follow-up mammogram. Mammogram ordered.  Hot flashes History consistent with hot flashes. Suspect related to menopause. Suspect heart rate elevated due to hot flash while she was in the office. Return to normal. We will check lab work as outlined below. Discussed treatment options. We'll determine treatment after labs return.  Ringworm Rash consistent with ringworm. Treat with topical medication.   Orders Placed This Encounter  Procedures  . MM DIAG BREAST TOMO BILATERAL    Standing Status:   Future    Standing Expiration Date:   06/15/2018    Order Specific Question:   Reason for Exam (SYMPTOM  OR DIAGNOSIS REQUIRED)    Answer:   prior microcalcifications, follow-up mammo    Order Specific Question:   Is the patient pregnant?    Answer:   No    Order Specific Question:   Preferred imaging location?    Answer:   [facility]  . Flu Vaccine QUAD 36+ mos IM  . [place]  . HgB A1c  . TSH  . CBC  . Comp Met (CMET)    Meds ordered this encounter  Medications  . ketoconazole (NIZORAL) 2 % cream    Sig: Apply 1 application topically daily.    Dispense:  15 g    Refill:  0   Tommi Rumps, MD [place]

## 2017-04-16 LAB — COMPREHENSIVE METABOLIC PANEL
ALBUMIN: 4.6 g/dL (ref 3.5–5.5)
ALT: 31 IU/L (ref 0–32)
AST: 27 IU/L (ref 0–40)
Albumin/Globulin Ratio: 1.5 (ref 1.2–2.2)
Alkaline Phosphatase: 89 IU/L (ref 39–117)
BILIRUBIN TOTAL: 0.3 mg/dL (ref 0.0–1.2)
BUN / CREAT RATIO: 10 (ref 9–23)
BUN: 10 mg/dL (ref 6–24)
CHLORIDE: 102 mmol/L (ref 96–106)
CO2: 23 mmol/L (ref 20–29)
CREATININE: 1.02 mg/dL — AB (ref 0.57–1.00)
Calcium: 10.2 mg/dL (ref 8.7–10.2)
GFR calc non Af Amer: 64 mL/min/{1.73_m2} (ref >59)
GFR, EST AFRICAN AMERICAN: 74 mL/min/{1.73_m2} (ref >59)
GLOBULIN, TOTAL: 3.1 g/dL (ref 1.5–4.5)
GLUCOSE: 152 mg/dL — AB (ref 65–99)
Potassium: 4.4 mmol/L (ref 3.5–5.2)
SODIUM: 142 mmol/L (ref 134–144)
TOTAL PROTEIN: 7.7 g/dL (ref 6.0–8.5)

## 2017-04-16 LAB — CBC
HEMATOCRIT: 40.3 % (ref 34.0–46.6)
HEMOGLOBIN: 12.9 g/dL (ref 11.1–15.9)
MCH: 27 pg (ref 26.6–33.0)
MCHC: 32 g/dL (ref 31.5–35.7)
MCV: 84 fL (ref 79–97)
Platelets: 314 10*3/uL (ref 150–379)
RBC: 4.78 x10E6/uL (ref 3.77–5.28)
RDW: 14.6 % (ref 12.3–15.4)
WBC: 7.9 10*3/uL (ref 3.4–10.8)

## 2017-04-16 LAB — HEMOGLOBIN A1C
ESTIMATED AVERAGE GLUCOSE: 137 mg/dL
Hgb A1c MFr Bld: 6.4 % — ABNORMAL HIGH (ref 4.8–5.6)

## 2017-04-16 LAB — TSH: TSH: 2.78 u[IU]/mL (ref 0.450–4.500)

## 2017-04-16 LAB — FOLLICLE STIMULATING HORMONE: FSH: 26 m[IU]/mL

## 2017-04-17 ENCOUNTER — Encounter: Payer: Self-pay | Admitting: Family Medicine

## 2017-04-19 ENCOUNTER — Other Ambulatory Visit: Payer: Self-pay | Admitting: Family Medicine

## 2017-04-19 MED ORDER — ESCITALOPRAM OXALATE 10 MG PO TABS: 10.0000 mg | ORAL_TABLET | Freq: Every day | ORAL | 1 refills | Status: DC

## 2017-07-09 ENCOUNTER — Ambulatory Visit: Payer: 59 | Admitting: Family Medicine

## 2017-07-16 ENCOUNTER — Ambulatory Visit: Payer: 59 | Admitting: Family Medicine

## 2017-07-19 ENCOUNTER — Other Ambulatory Visit: Payer: Self-pay | Admitting: Family Medicine

## 2017-07-19 DIAGNOSIS — E119 Type 2 diabetes mellitus without complications: Secondary | ICD-10-CM

## 2017-09-12 ENCOUNTER — Other Ambulatory Visit: Payer: Self-pay | Admitting: Family Medicine

## 2017-10-01 ENCOUNTER — Telehealth: Payer: Self-pay

## 2017-10-01 NOTE — Telephone Encounter (Signed)
Prior Authorization completed for Trulicity 0.75/0.435ml on 10-01-17 @ 8:26 waiting for approval.

## 2017-10-05 ENCOUNTER — Other Ambulatory Visit: Payer: Self-pay | Admitting: Family Medicine

## 2017-10-05 DIAGNOSIS — I10 Essential (primary) hypertension: Secondary | ICD-10-CM

## 2017-10-30 ENCOUNTER — Other Ambulatory Visit: Payer: Self-pay | Admitting: Family Medicine

## 2017-12-22 ENCOUNTER — Other Ambulatory Visit: Payer: Self-pay

## 2017-12-22 MED ORDER — ESCITALOPRAM OXALATE 10 MG PO TABS
10.0000 mg | ORAL_TABLET | Freq: Every day | ORAL | 1 refills | Status: DC
Start: 2017-12-22 — End: 2018-11-05

## 2018-05-20 DIAGNOSIS — Z23 Encounter for immunization: Secondary | ICD-10-CM | POA: Diagnosis not present

## 2018-06-21 ENCOUNTER — Telehealth: Payer: Self-pay | Admitting: Family Medicine

## 2018-06-21 DIAGNOSIS — R928 Other abnormal and inconclusive findings on diagnostic imaging of breast: Secondary | ICD-10-CM

## 2018-06-21 NOTE — Telephone Encounter (Signed)
Please call the patient. I received an overdue mammogram reminder for her. It appears she was supposed to have a follow up mammogram and this has not been done. I would like her to have this completed. I can place the order once you speak with her. Thanks.

## 2018-06-22 NOTE — Telephone Encounter (Signed)
Called and spoke with patient. Pt would like to have the orders placed for her to have her MM done. Sent to PCP.

## 2018-06-25 NOTE — Telephone Encounter (Signed)
Order placed.  [facility]

## 2018-06-25 NOTE — Addendum Note (Signed)
Addended by: Glori LuisSONNENBERG, Constant [place] G on: 06/25/2018 06:07 PM   Modules accepted: Orders

## 2018-07-20 ENCOUNTER — Encounter: Payer: Self-pay | Admitting: Family Medicine

## 2018-07-22 ENCOUNTER — Other Ambulatory Visit: Payer: Self-pay | Admitting: Family Medicine

## 2018-07-22 DIAGNOSIS — E119 Type 2 diabetes mellitus without complications: Secondary | ICD-10-CM

## 2018-07-23 NOTE — Telephone Encounter (Signed)
Patient has not been seen in over a year.  Please contact her to get her scheduled for follow-up.  Once she is scheduled I will consider refilling the medication to cover until she gets to that visit.

## 2018-07-30 ENCOUNTER — Ambulatory Visit
Admission: RE | Admit: 2018-07-30 | Discharge: 2018-07-30 | Disposition: A | Discharge status: Home / Self Care | Payer: 59 | Source: Ambulatory Visit | Attending: Family Medicine | Admitting: Family Medicine

## 2018-07-30 DIAGNOSIS — R921 Mammographic calcification found on diagnostic imaging of breast: Secondary | ICD-10-CM | POA: Diagnosis not present

## 2018-07-30 DIAGNOSIS — R928 Other abnormal and inconclusive findings on diagnostic imaging of breast: Secondary | ICD-10-CM | POA: Diagnosis not present

## 2018-08-17 DIAGNOSIS — H524 Presbyopia: Secondary | ICD-10-CM | POA: Diagnosis not present

## 2018-08-17 DIAGNOSIS — E119 Type 2 diabetes mellitus without complications: Secondary | ICD-10-CM | POA: Diagnosis not present

## 2018-08-20 LAB — HM DIABETES EYE EXAM

## 2018-09-11 ENCOUNTER — Telehealth: Payer: Self-pay

## 2018-09-11 NOTE — Telephone Encounter (Signed)
[name] (Key: [id number])  Trulicity 0.75MG /0.5ML pen-injecto  PA has been done for Trulicity   Waiting on determination.

## 2018-09-22 ENCOUNTER — Ambulatory Visit: Payer: 59 | Admitting: Family Medicine

## 2018-11-04 ENCOUNTER — Other Ambulatory Visit: Payer: Self-pay | Admitting: Family Medicine

## 2018-11-05 ENCOUNTER — Other Ambulatory Visit: Payer: Self-pay | Admitting: Family Medicine

## 2018-11-05 DIAGNOSIS — I10 Essential (primary) hypertension: Secondary | ICD-10-CM

## 2019-04-20 ENCOUNTER — Other Ambulatory Visit: Payer: Self-pay | Admitting: Family Medicine

## 2019-05-06 ENCOUNTER — Other Ambulatory Visit: Payer: Self-pay | Admitting: Family Medicine

## 2019-05-06 DIAGNOSIS — E119 Type 2 diabetes mellitus without complications: Secondary | ICD-10-CM

## 2019-05-25 ENCOUNTER — Other Ambulatory Visit: Payer: Self-pay | Admitting: Family Medicine

## 2019-06-20 ENCOUNTER — Other Ambulatory Visit: Payer: Self-pay | Admitting: Family Medicine

## 2019-07-17 ENCOUNTER — Other Ambulatory Visit: Payer: Self-pay | Admitting: Family Medicine

## 2019-09-21 LAB — HM DIABETES EYE EXAM

## 2019-09-29 ENCOUNTER — Other Ambulatory Visit: Payer: Self-pay | Admitting: Family Medicine

## 2019-09-29 DIAGNOSIS — I10 Essential (primary) hypertension: Secondary | ICD-10-CM

## 2019-10-03 ENCOUNTER — Other Ambulatory Visit: Payer: Self-pay | Admitting: Family Medicine

## 2019-10-03 DIAGNOSIS — E119 Type 2 diabetes mellitus without complications: Secondary | ICD-10-CM

## 2020-03-16 ENCOUNTER — Ambulatory Visit: Payer: 59 | Admitting: Nurse Practitioner

## 2020-03-16 ENCOUNTER — Encounter: Payer: Self-pay | Admitting: Nurse Practitioner

## 2020-03-16 ENCOUNTER — Other Ambulatory Visit: Payer: Self-pay

## 2020-03-16 VITALS — BP 152/94 | HR 105 | Temp 98.4°F | Ht 70.0 in | Wt 264.0 lb

## 2020-03-16 DIAGNOSIS — N611 Abscess of the breast and nipple: Secondary | ICD-10-CM | POA: Insufficient documentation

## 2020-03-16 DIAGNOSIS — L03313 Cellulitis of chest wall: Secondary | ICD-10-CM | POA: Insufficient documentation

## 2020-03-16 DIAGNOSIS — N61 Mastitis without abscess: Secondary | ICD-10-CM | POA: Insufficient documentation

## 2020-03-16 MED ORDER — SACCHAROMYCES BOULARDII 250 MG PO CAPS: 250.0000 mg | ORAL_CAPSULE | Freq: Two times a day (BID) | ORAL | 0 refills | Status: DC

## 2020-03-16 MED ORDER — DOXYCYCLINE HYCLATE 100 MG PO TABS: 100.0000 mg | ORAL_TABLET | Freq: Two times a day (BID) | ORAL | 0 refills | Status: DC

## 2020-03-16 NOTE — Progress Notes (Signed)
Established Patient Office Visit  Subjective:  Patient ID: Rebecca Hobbs, female    DOB: 1965-11-04  Age: 54 y.o. MRN: [id number]  CC:  Chief Complaint  Patient presents with  . Spot On Breast    Pt c/o discoloration, swelling, tenderness along left breast. Pt was using Peppermint oil on a "spot" on her upper left breast x1week. In that week the spot has grown and became red and painful to touch.  . Medication question    Pt would like to discuss Losartan and pravastatin. Needs a refill but she has not had them since 2019    HPI Rebecca Hobbs is a 55 yo with Hx of HTN noticed a blackhead on her right breast. She applied peppermint oil- as she has for years because it is soothing and smells good last week. She developed a red spot on right breast the following day of applying peppermint oil. The next day a red circle around the previous area was noted and it continues to enlarge, is warm to touch and started swelling on Sun, with a blister on it. Very tender. No fever or chills or malaise. She feels well.   Past Medical History:  Diagnosis Date  . Frequent headaches   . Hypertension    elevated b/p readings x 2 years.  . Pseudotumor cerebri syndrome     Past Surgical History:  Procedure Laterality Date  . BREAST BIOPSY Left 1992  . BREAST BIOPSY Left 10/16/2016   Neg.  Marland Kitchen LAPAROSCOPIC TOTAL HYSTERECTOMY  2012   Cervix intact    Family History  Problem Relation Age of Onset  . Hypertension Mother   . Hypertension Father   . COPD Father   . Heart disease Father   . Hypertension Brother   . Diverticulitis Sister   . Heart disease Brother 50       deceased from MI  . Hypertension Brother   . Hypertension Brother   . Diabetes Maternal Grandmother   . Arthritis Maternal Grandfather   . Diabetes Paternal Grandmother   . Breast cancer Maternal Aunt 53    Social History   Socioeconomic History  . Marital status: Divorced    Spouse name: Not on file  . Number of  children: 1  . Years of education: 69  . Highest education level: Not on file  Occupational History  . 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 Smoking status: Never Smoker  . Smokeless tobacco: Never Used  Substance and Sexual Activity  . Alcohol use: Yes    Alcohol/week: 0.0 standard drinks    Comment: 1 glass of wine monthly, if any  . Drug use: No  . Sexual activity: Not Currently    Partners: Male    Birth control/protection: Surgical    Comment: 1 partner   Other Topics Concern  . Not on file  Social History Narrative   Hennessey grew up in [place], [place]. She is divorced and has 1 daughter (Rebecca Hobbs age 16) and a grandson (Rebecca Hobbs age 10 yo). Her daughter and grandson live in [place]. 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 She makes business cards, church bulletins or anything for special occasions. She attends [employer] [address] and is very active in her church.   Social Determinants of Health   Financial Resource Strain:   . Difficulty of Paying Living Expenses:  Not on file  Food Insecurity:   . Worried About Programme researcher, broadcasting/film/videounning Out of Food in the Last Year: Not on file  . Ran Out of Food in the Last Year: Not on file  Transportation Needs:   . Lack of Transportation (Medical): Not on file  . Lack of Transportation (Non-Medical): Not on file  Physical Activity:   . Days of Exercise per Week: Not on file  . Minutes of Exercise per Session: Not on file  Stress:   . Feeling of Stress : Not on file  Social Connections:   . Frequency of Communication with Friends and Family: Not on file  . Frequency of Social Gatherings with Friends and Family: Not on file  . Attends Religious Services: Not on file  . Active Member of Clubs or Organizations: Not on file  . Attends BankerClub or Organization Meetings: Not on file  . Marital Status: Not on file  Intimate Partner  Violence:   . Fear of Current or Ex-Partner: Not on file  . Emotionally Abused: Not on file  . Physically Abused: Not on file  . Sexually Abused: Not on file    Outpatient Medications Prior to Visit  Medication Sig Dispense Refill  . amLODipine (NORVASC) 5 MG tablet TAKE 1 TABLET BY MOUTH  DAILY 90 tablet 3  . escitalopram (LEXAPRO) 10 MG tablet TAKE 1 TABLET BY MOUTH  DAILY 30 tablet 11  . glimepiride (AMARYL) 4 MG tablet Take 1 tablet (4 mg total) by mouth daily with breakfast. 90 tablet 3  . glucose blood (ONETOUCH VERIO) test strip Use as instructed for CBG testing twice daily. E11.9. Quantity Sufficient for 90 day supply 200 each 3  . ketoconazole (NIZORAL) 2 % cream Apply 1 application topically daily. 15 g 0  . losartan-hydrochlorothiazide (HYZAAR) 100-12.5 MG tablet TAKE 1 TABLET BY MOUTH  DAILY 90 tablet 1  . Multiple Vitamin (MULTIVITAMIN) tablet Take 1 tablet by mouth daily.    . pravastatin (PRAVACHOL) 40 MG tablet TAKE 1 TABLET BY MOUTH  DAILY 90 tablet 1  . TRULICITY 1.5 MG/0.5ML SOPN INJECT THE CONTENTS OF 1  PEN (1.5MG ) SUBCUTANEOUSLY  WEEKLY AS DIRECTED 6 mL 3   No facility-administered medications prior to visit.    Allergies  Allergen Reactions  . Metformin And Related Diarrhea and Other (See Comments)    Diarrhea and Stomach Pain  . Atorvastatin Other (See Comments)    Stomach Pain Other reaction(s): Other (See Comments) Stomach Pain  . Invokana [Canagliflozin] Rash  . Lisinopril Cough    Review of Systems Pertinent positives none history of present illness otherwise negative.   Objective:    Physical Exam Vitals reviewed.  Constitutional:      Appearance: Normal appearance. She is obese.  HENT:     Head: Normocephalic and atraumatic.  Cardiovascular:     Rate and Rhythm: Regular rhythm.     Pulses: Normal pulses.     Heart sounds: Normal heart sounds.  Pulmonary:     Effort: Pulmonary effort is normal.     Breath sounds: Normal breath sounds.    Musculoskeletal:     Cervical back: Normal range of motion and neck supple.  Skin:    Comments: Right breast with abscess noted right upper quadrant at 10:00 .A pinprick white dot noted and no drainage past or present.  Nipple is covered with drape and not involved.  Neurological:     General: No focal deficit present.     Mental Status: She is  alert and oriented to person, place, and time.  Psychiatric:        Mood and Affect: Mood normal.        Behavior: Behavior normal.     BP (!) 152/94 (BP Location: Left Arm, Patient Position: Sitting)   Pulse (!) 105   Temp 98.4 F (36.9 C)   Ht 5\' 10"  (1.778 m)   Wt 264 lb (119.7 kg)   SpO2 99%   BMI 37.88 kg/m  Wt Readings from Last 3 Encounters:  03/16/20 264 lb (119.7 kg)  04/15/17 251 lb (113.9 kg)  02/04/17 249 lb 3.2 oz (113 kg)   Pulse Readings from Last 3 Encounters:  03/16/20 (!) 105  04/15/17 97  02/04/17 94    BP Readings from Last 3 Encounters:  03/16/20 (!) 152/94  04/15/17 132/78  02/04/17 120/80    Lab Results  Component Value Date   CHOL 179 08/07/2016   HDL 54 08/07/2016   LDLCALC 102 (H) 08/07/2016   LDLDIRECT 48 12/16/2016   TRIG 117 08/07/2016   CHOLHDL 3.3 08/07/2016      Health Maintenance Due  Topic Date Due  . Hepatitis C Screening  Never done  . COVID-19 Vaccine (1) Never done  . FOOT EXAM  08/07/2017  . HEMOGLOBIN A1C  10/14/2017  . INFLUENZA VACCINE  01/30/2020    There are no preventive care reminders to display for this patient.    Assessment & Plan:   Problem List Items Addressed This Visit      Other   Abscess of right breast - Primary      Meds ordered this encounter  Medications  . DISCONTD: doxycycline (VIBRA-TABS) 100 MG tablet    Sig: Take 1 tablet (100 mg total) by mouth 2 (two) times daily.    Dispense:  14 tablet    Refill:  0    Order Specific Question:   Supervising Provider    Answer:   03/31/2020 Dale Bevier  . DISCONTD: saccharomyces boulardii  (FLORASTOR) 250 MG capsule    Sig: Take 1 capsule (250 mg total) by mouth 2 (two) times daily.    Dispense:  14 capsule    Refill:  0    Order Specific Question:   Supervising Provider    Answer:   [id number] Dale [employer]  . doxycycline (VIBRA-TABS) 100 MG tablet    Sig: Take 1 tablet (100 mg total) by mouth 2 (two) times daily.    Dispense:  14 tablet    Refill:  0    Order Specific Question:   Supervising Provider    Answer:   [id number] Dale Quinby  . saccharomyces boulardii (FLORASTOR) 250 MG capsule    Sig: Take 1 capsule (250 mg total) by mouth 2 (two) times daily.    Dispense:  14 capsule    Refill:  0    Order Specific Question:   Supervising Provider    Answer:   [id number] Dale [place]   An abscess is noted in the right upper quadrant of the breast.  Treat with doxycycline 100 mg twice daily for 7 days and probiotic such as Florastor for 14 days. There is no drainage but expect the white pinprick to a drain.  Patient was told about this and to allow drainage to occur. She was advised to apply warm moist heat. She is to monitor the site daily and promptly report if it does not improve as expected.  She can be seen over the weekend in acute  care if it seems to worsen, or if she develops any fevers or chills.    Follow-up: Return in about 1 week (around 03/23/2020).   This visit occurred during the SARS-CoV-2 public health emergency.  Safety protocols were in place, including screening questions prior to the visit, additional usage of staff PPE, and extensive cleaning of exam room while observing appropriate contact time as indicated for disinfecting solutions.   Amedeo Kinsman, NP

## 2020-03-16 NOTE — Patient Instructions (Addendum)
Apply warm moist heat for 10 minutes as often routine throughout the day. This will help keep blood flow to the area and get the antibiotic to the area.  This abscess may drain. Keep it covered and clean. Draining purulent discharge is good.  Please take the doxycycline as directed. This can make you sensitive to sunburn so be sure to wear long sleeves, sunscreen, and stay in the shade.  I have also ordered a probiotic to take on during the week you are on the doxycycline and for the following week. This is to help prevent diarrhea.  Please follow-up for a wound check in 1 week with Dr. Birdie Sons.  Monitor for any fevers, chills, or worsening redness, pain, swelling and notify us promptly if this gets worse instead of better. Contact a health care provider if:  You have a fever.  Your symptoms do not begin to improve within 1-2 days of starting treatment.  Your bone or joint underneath the infected area becomes painful after the skin has healed.  Your infection returns in the same area or another area.  You notice a swollen bump in the infected area.  You develop new symptoms.  You have a general ill feeling (malaise) with muscle aches and pains  Cellulitis, Adult  Cellulitis is a skin infection. The infected area is usually warm, red, swollen, and tender. This condition occurs most often in the arms and lower legs. The infection can travel to the muscles, blood, and underlying tissue and become serious. It is very important to get treated for this condition. What are the causes? Cellulitis is caused by bacteria. The bacteria enter through a break in the skin, such as a cut, burn, insect bite, open sore, or crack. What increases the risk? This condition is more likely to occur in people who:  Have a weak body defense system (immune system).  Have open wounds on the skin, such as cuts, burns, bites, and scrapes. Bacteria can enter the body through these open wounds.  Are older  than 53 years of age.  Have diabetes.  Have a type of long-lasting (chronic) liver disease (cirrhosis) or kidney disease.  Are obese.  Have a skin condition such as: ? Itchy rash (eczema). ? Slow movement of blood in the veins (venous stasis). ? Fluid buildup below the skin (edema).  Have had radiation therapy.  Use IV drugs. What are the signs or symptoms? Symptoms of this condition include:  Redness, streaking, or spotting on the skin.  Swollen area of the skin.  Tenderness or pain when an area of the skin is touched.  Warm skin.  A fever.  Chills.  Blisters. How is this diagnosed? This condition is diagnosed based on a medical history and physical exam. You may also have tests, including:  Blood tests.  Imaging tests. How is this treated? Treatment for this condition may include:  Medicines, such as antibiotic medicines or medicines to treat allergies (antihistamines).  Supportive care, such as rest and application of cold or warm cloths (compresses) to the skin.  Hospital care, if the condition is severe. The infection usually starts to get better within 1-2 days of treatment. Follow these instructions at home:  Medicines  Take over-the-counter and prescription medicines only as told by your health care provider.  If you were prescribed an antibiotic medicine, take it as told by your health care provider. Do not stop taking the antibiotic even if you start to feel better. General instructions  Drink enough fluid  to keep your urine pale yellow.  Do not touch or rub the infected area.  Raise (elevate) the infected area above the level of your heart while you are sitting or lying down.  Apply warm or cold compresses to the affected area as told by your health care provider.  Keep all follow-up visits as told by your health care provider. This is important. These visits let your health care provider make sure a more serious infection is not  developing. Contact a health care provider if:  You have a fever.  Your symptoms do not begin to improve within 1-2 days of starting treatment.  Your bone or joint underneath the infected area becomes painful after the skin has healed.  Your infection returns in the same area or another area.  You notice a swollen bump in the infected area.  You develop new symptoms.  You have a general ill feeling (malaise) with muscle aches and pains. Get help right away if:  Your symptoms get worse.  You feel very sleepy.  You develop vomiting or diarrhea that persists.  You notice red streaks coming from the infected area.  Your red area gets larger or turns dark in color. These symptoms may represent a serious problem that is an emergency. Do not wait to see if the symptoms will go away. Get medical help right away. Call your local emergency services (911 in the [place] Do not drive yourself to the hospital. Summary  Cellulitis is a skin infection. This condition occurs most often in the arms and lower legs.  Treatment for this condition may include medicines, such as antibiotic medicines or antihistamines.  Take over-the-counter and prescription medicines only as told by your health care provider. If you were prescribed an antibiotic medicine, do not stop taking the antibiotic even if you start to feel better.  Contact a health care provider if your symptoms do not begin to improve within 1-2 days of starting treatment or your symptoms get worse.  Keep all follow-up visits as told by your health care provider. This is important. These visits let your health care provider make sure that a more serious infection is not developing. This information is not intended to replace advice given to you by your health care provider. Make sure you discuss any questions you have with your health care provider. Document Revised: 11/06/2017 Document Reviewed: 11/06/2017 Elsevier Patient Education  2020  [employer].

## 2020-03-18 ENCOUNTER — Encounter: Payer: Self-pay | Admitting: Nurse Practitioner

## 2020-03-21 ENCOUNTER — Other Ambulatory Visit: Payer: Self-pay

## 2020-03-23 ENCOUNTER — Encounter: Payer: Self-pay | Admitting: Nurse Practitioner

## 2020-03-23 ENCOUNTER — Other Ambulatory Visit: Payer: Self-pay

## 2020-03-23 ENCOUNTER — Ambulatory Visit: Payer: 59 | Admitting: Nurse Practitioner

## 2020-03-23 VITALS — BP 124/86 | HR 96 | Temp 97.6°F | Ht 70.0 in | Wt 264.0 lb

## 2020-03-23 DIAGNOSIS — N611 Abscess of the breast and nipple: Secondary | ICD-10-CM | POA: Diagnosis not present

## 2020-03-23 MED ORDER — DOXYCYCLINE HYCLATE 100 MG PO TABS: 100.0000 mg | ORAL_TABLET | Freq: Two times a day (BID) | ORAL | 0 refills | Status: DC

## 2020-03-23 NOTE — Progress Notes (Signed)
Established Patient Office Visit  Subjective:  Patient ID: Rebecca Hobbs, female    DOB: [birth year]  Age: 54 y.o. MRN: [id number]  CC:  Chief Complaint  Patient presents with  . Follow-up    bite    HPI Rebecca Hobbs presents for a 1 week follow-up of right breast abscess.  She completed doxycycline 100 mg twice daily 7-day course yesterday.  She reports she is much better but there is still drainage, and a little firmness at the site with tenderness.  She has noted no fevers, chills, body aches or malaise.  The site did drain a little bloody brownish creamy discharge.  She has noted no diarrhea or abdominal complaints with the doxycycline.  Past Medical History:  Diagnosis Date  . Frequent headaches   . Hypertension    elevated b/p readings x 2 years.  . Pseudotumor cerebri syndrome     Past Surgical History:  Procedure Laterality Date  . BREAST BIOPSY Left 1992  . BREAST BIOPSY Left 10/16/2016   Neg.  Marland Kitchen LAPAROSCOPIC TOTAL HYSTERECTOMY  2012   Cervix intact    Family History  Problem Relation Age of Onset  . Hypertension Mother   . Hypertension Father   . COPD Father   . Heart disease Father   . Hypertension Brother   . Diverticulitis Sister   . Heart disease Brother 50       deceased from MI  . Hypertension Brother   . Hypertension Brother   . Diabetes Maternal Grandmother   . Arthritis Maternal Grandfather   . Diabetes Paternal Grandmother   . Breast cancer Maternal Aunt 75    Social History   Socioeconomic History  . Marital status: Divorced    Spouse name: Not on file  . Number of children: 1  . Years of education: 36  . Highest education level: Not on file  Occupational History  . 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 Smoking status: Never Smoker  . Smokeless tobacco: Never Used  Substance and Sexual Activity  . Alcohol use: Yes    Alcohol/week: 0.0 standard drinks    Comment: 1 glass of wine  monthly, if any  . Drug use: No  . Sexual activity: Not Currently    Partners: Male    Birth control/protection: Surgical    Comment: 1 partner   Other Topics Concern  . Not on file  Social History Narrative   Narcissus grew up in [place], [place]. She is divorced and has 1 daughter ([name] age 14) and a grandson ([name] age 79 yo). Her daughter and grandson live in [place]. 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 She makes business cards, church bulletins or anything for special occasions. She attends [place] [employer] [address] and is very active in her church.   Social Determinants of Health   Financial Resource Strain:   . Difficulty of Paying Living Expenses: Not on file  Food Insecurity:   . Worried About Programme researcher, broadcasting/film/video in the Last Year: Not on file  . Ran Out of Food in the Last Year: Not on file  Transportation Needs:   . Lack of Transportation (Medical): Not on file  . Lack of Transportation (Non-Medical): Not on file  Physical Activity:   . Days of Exercise per Week: Not on file  . Minutes of Exercise per Session:  Not on file  Stress:   . Feeling of Stress : Not on file  Social Connections:   . Frequency of Communication with Friends and Family: Not on file  . Frequency of Social Gatherings with Friends and Family: Not on file  . Attends Religious Services: Not on file  . Active Member of Clubs or Organizations: Not on file  . Attends Banker Meetings: Not on file  . Marital Status: Not on file  Intimate Partner Violence:   . Fear of Current or Ex-Partner: Not on file  . Emotionally Abused: Not on file  . Physically Abused: Not on file  . Sexually Abused: Not on file    Outpatient Medications Prior to Visit  Medication Sig Dispense Refill  . amLODipine (NORVASC) 5 MG tablet TAKE 1 TABLET BY MOUTH  DAILY 90 tablet 3  . escitalopram (LEXAPRO) 10 MG tablet TAKE 1 TABLET BY MOUTH  DAILY  30 tablet 11  . glimepiride (AMARYL) 4 MG tablet Take 1 tablet (4 mg total) by mouth daily with breakfast. 90 tablet 3  . glucose blood (ONETOUCH VERIO) test strip Use as instructed for CBG testing twice daily. E11.9. Quantity Sufficient for 90 day supply 200 each 3  . ketoconazole (NIZORAL) 2 % cream Apply 1 application topically daily. 15 g 0  . losartan-hydrochlorothiazide (HYZAAR) 100-12.5 MG tablet TAKE 1 TABLET BY MOUTH  DAILY 90 tablet 1  . Multiple Vitamin (MULTIVITAMIN) tablet Take 1 tablet by mouth daily.    . pravastatin (PRAVACHOL) 40 MG tablet TAKE 1 TABLET BY MOUTH  DAILY 90 tablet 1  . saccharomyces boulardii (FLORASTOR) 250 MG capsule Take 1 capsule (250 mg total) by mouth 2 (two) times daily. 14 capsule 0  . TRULICITY 1.5 MG/0.5ML SOPN INJECT THE CONTENTS OF 1  PEN (1.5MG ) SUBCUTANEOUSLY  WEEKLY AS DIRECTED 6 mL 3  . doxycycline (VIBRA-TABS) 100 MG tablet Take 1 tablet (100 mg total) by mouth 2 (two) times daily. (Patient not taking: Reported on 03/23/2020) 14 tablet 0   No facility-administered medications prior to visit.    Allergies  Allergen Reactions  . Metformin And Related Diarrhea and Other (See Comments)    Diarrhea and Stomach Pain  . Atorvastatin Other (See Comments)    Stomach Pain Other reaction(s): Other (See Comments) Stomach Pain  . Invokana [Canagliflozin] Rash  . Lisinopril Cough    Review of Systems Pertinent positives noted in the history of present illness otherwise negative.   Objective:    Physical Exam Vitals reviewed.  Constitutional:      Appearance: Normal appearance. She is obese.  Cardiovascular:     Rate and Rhythm: Normal rate and regular rhythm.     Pulses: Normal pulses.     Heart sounds: Normal heart sounds.  Pulmonary:     Effort: Pulmonary effort is normal.     Breath sounds: Normal breath sounds.  Chest:    Musculoskeletal:     Cervical back: Normal range of motion and neck supple.  Lymphadenopathy:     Upper Body:       Right upper body: No axillary adenopathy.  Neurological:     Mental Status: She is alert.        BP 124/86 (BP Location: Left Arm, Patient Position: Sitting, Cuff Size: Large)   Pulse 96   Temp 97.6 F (36.4 C) (Oral)   Ht 5\' 10"  (1.778 m)   Wt 264 lb (119.7 kg)   SpO2 99%   BMI 37.88 kg/m  Wt Readings from Last 3 Encounters:  03/23/20 264 lb (119.7 kg)  03/16/20 264 lb (119.7 kg)  04/15/17 251 lb (113.9 kg)   Pulse Readings from Last 3 Encounters:  03/23/20 96  03/16/20 (!) 105  04/15/17 97    BP Readings from Last 3 Encounters:  03/23/20 124/86  03/16/20 (!) 152/94  04/15/17 132/78    There are no preventive care reminders to display for this patient.     Assessment & Plan:   Problem List Items Addressed This Visit      Other   Abscess of right breast - Primary    Developed red and warm area on 03/12/2020.  She completed 7-day course of doxycycline yesterday with marked improvement in the swelling tenderness and redness.  The site has been draining and continues to drain with an indurated area inferiorly.  Able to express a little pus.  We will refill her doxycycline and ordered an urgent referral to general surgery as suspect she is going to need an I&D of this abscess.  Patient has no constitutional symptoms and is feeling well.  Advised to avoid sunburn, take probiotics, and continue with warm compresses.       Relevant Orders   Ambulatory referral to General Surgery      Meds ordered this encounter  Medications  . doxycycline (VIBRA-TABS) 100 MG tablet    Sig: Take 1 tablet (100 mg total) by mouth 2 (two) times daily.    Dispense:  14 tablet    Refill:  0    Order Specific Question:   Supervising Provider    Answer:   Dale Rives [[id number]]   Pt advised:  I have placed referral into general surgery to further evaluate your breast abscess.  We are thinking that it may need incision and drainage to help get the rest of the infection removed.  Let  me know if you have not received a call for an appointment by tomorrow.  Please restart the doxycycline as you have been doing. Do not to take it before bed. Please drink a full glass of water with the pill.  Use sunscreen and shade to prevent sun burn.   Take a probiotic such as Research scientist (life sciences), Align, Culturelle, and eat yogurt daily to help prevent diarrhea  Continue with warm compresses.   You do not have to mash on or squeeze this abscess.  It will drain on its own.  Keep it clean and dry like you are doing.  Excellent job as there is no sign of cellulitis.    Please follow-up in 2 weeks with Dr. Birdie Sons. Follow-up: Return in about 2 weeks (around 04/06/2020).   This visit occurred during the SARS-CoV-2 public health emergency.  Safety protocols were in place, including screening questions prior to the visit, additional usage of staff PPE, and extensive cleaning of exam room while observing appropriate contact time as indicated for disinfecting solutions.   Amedeo Kinsman, NP

## 2020-03-23 NOTE — Patient Instructions (Addendum)
I have placed referral into general surgery to further evaluate your breast abscess.  We are thinking that it may need incision and drainage to help get the rest of the infection removed.  Let me know if you have not received a call for an appointment by tomorrow.  Please restart the doxycycline as you have been doing. Do not to take it before bed. Please drink a full glass of water with the pill.  Use sunscreen and shade to prevent sun burn.   Take a probiotic such as Research scientist (life sciences), Align, Culturelle, and eat yogurt daily to help prevent diarrhea  Continue with warm compresses.   You do not have to mash on or squeeze this abscess.  It will drain on its own.  Keep it clean and dry like you are doing.  Excellent job as there is no sign of cellulitis.    Please follow-up in 2 weeks with Dr. Birdie Sons.

## 2020-03-23 NOTE — Assessment & Plan Note (Addendum)
Developed red and warm area on 03/12/2020.  She completed 7-day course of doxycycline yesterday with marked improvement in the swelling tenderness and redness.  The site has been draining and continues to drain with an indurated area inferiorly.  Able to express a little pus.  We will refill her doxycycline and ordered an urgent referral to general surgery as suspect she is going to need an I&D of this abscess.  Patient has no constitutional symptoms and is feeling well.  Advised to avoid sunburn, take probiotics, and continue with warm compresses.

## 2020-04-04 ENCOUNTER — Other Ambulatory Visit: Payer: Self-pay

## 2020-04-06 ENCOUNTER — Ambulatory Visit: Payer: 59 | Admitting: Nurse Practitioner

## 2020-04-06 ENCOUNTER — Other Ambulatory Visit: Payer: Self-pay

## 2020-04-06 ENCOUNTER — Encounter: Payer: Self-pay | Admitting: Nurse Practitioner

## 2020-04-06 VITALS — BP 130/84 | HR 107 | Temp 98.5°F | Ht 70.0 in | Wt 262.0 lb

## 2020-04-06 DIAGNOSIS — N611 Abscess of the breast and nipple: Secondary | ICD-10-CM

## 2020-04-06 DIAGNOSIS — Z23 Encounter for immunization: Secondary | ICD-10-CM

## 2020-04-06 NOTE — Progress Notes (Signed)
Established Patient Office Visit  Subjective:  Patient ID: Rebecca Hobbs, female    DOB: 1966-01-01  Age: 54 y.o. MRN: [id number]  CC:  Chief Complaint  Patient presents with  . Follow-up    HPI Alianys Chacko presents for follow-up of right breast abscess.  She has seen the surgeon Dr. Maia Plan and he did an I&D on 03/24/2020.  She completed her doxycycline course 1 week ago.  She has since had follow-up with Dr. Maia Plan on 03/31/2020 and reported good healing with no sign of infection.  She was set up for a follow-up visit here today.  Patient has noted no redness, warmth, drainage, fevers or chills.  She feels great.  She is keeping the area covered with a gauze pad under Band-Aid.  Past Medical History:  Diagnosis Date  . Frequent headaches   . Hypertension    elevated b/p readings x 2 years.  . Pseudotumor cerebri syndrome     Past Surgical History:  Procedure Laterality Date  . BREAST BIOPSY Left 1992  . BREAST BIOPSY Left 10/16/2016   Neg.  Marland Kitchen LAPAROSCOPIC TOTAL HYSTERECTOMY  2012   Cervix intact    Family History  Problem Relation Age of Onset  . Hypertension Mother   . Hypertension Father   . COPD Father   . Heart disease Father   . Hypertension Brother   . Diverticulitis Sister   . Heart disease Brother 50       deceased from MI  . Hypertension Brother   . Hypertension Brother   . Diabetes Maternal Grandmother   . Arthritis Maternal Grandfather   . Diabetes Paternal Grandmother   . Breast cancer Maternal Aunt 27    Social History   Socioeconomic History  . Marital status: Divorced    Spouse name: Not on file  . Number of children: 1  . Years of education: 66  . Highest education level: Not on file  Occupational History  . 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 Smoking status: Never Smoker  . Smokeless tobacco: Never Used  Substance and Sexual Activity  . Alcohol use: Yes    Alcohol/week: 0.0  standard drinks    Comment: 1 glass of wine monthly, if any  . Drug use: No  . Sexual activity: Not Currently    Partners: Male    Birth control/protection: Surgical    Comment: 1 partner   Other Topics Concern  . Not on file  Social History Narrative   Rebecca Hobbs grew up in [place], [place]. She is divorced and has 1 daughter (Rebecca Hobbs age 66) and a grandson (Rebecca Hobbs age 70 yo). Her daughter and grandson live in [place]. 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 She makes business cards, church bulletins or anything for special occasions. She attends [employer] [address] and is very active in her church.   Social Determinants of Health   Financial Resource Strain:   . Difficulty of Paying Living Expenses: Not on file  Food Insecurity:   . Worried About Programme researcher, broadcasting/film/video in the Last Year: Not on file  . Ran Out of Food in the Last Year: Not on file  Transportation Needs:   . Lack of Transportation (Medical): Not on file  . Lack of Transportation (Non-Medical): Not on file  Physical Activity:   . Days of Exercise per Week:  Not on file  . Minutes of Exercise per Session: Not on file  Stress:   . Feeling of Stress : Not on file  Social Connections:   . Frequency of Communication with Friends and Family: Not on file  . Frequency of Social Gatherings with Friends and Family: Not on file  . Attends Religious Services: Not on file  . Active Member of Clubs or Organizations: Not on file  . Attends Banker Meetings: Not on file  . Marital Status: Not on file  Intimate Partner Violence:   . Fear of Current or Ex-Partner: Not on file  . Emotionally Abused: Not on file  . Physically Abused: Not on file  . Sexually Abused: Not on file    Outpatient Medications Prior to Visit  Medication Sig Dispense Refill  . amLODipine (NORVASC) 5 MG tablet TAKE 1 TABLET BY MOUTH  DAILY 90 tablet 3  . escitalopram  (LEXAPRO) 10 MG tablet TAKE 1 TABLET BY MOUTH  DAILY 30 tablet 11  . glimepiride (AMARYL) 4 MG tablet Take 1 tablet (4 mg total) by mouth daily with breakfast. 90 tablet 3  . glucose blood (ONETOUCH VERIO) test strip Use as instructed for CBG testing twice daily. E11.9. Quantity Sufficient for 90 day supply 200 each 3  . ketoconazole (NIZORAL) 2 % cream Apply 1 application topically daily. 15 g 0  . losartan-hydrochlorothiazide (HYZAAR) 100-12.5 MG tablet TAKE 1 TABLET BY MOUTH  DAILY 90 tablet 1  . Multiple Vitamin (MULTIVITAMIN) tablet Take 1 tablet by mouth daily.    . pravastatin (PRAVACHOL) 40 MG tablet TAKE 1 TABLET BY MOUTH  DAILY 90 tablet 1  . saccharomyces boulardii (FLORASTOR) 250 MG capsule Take 1 capsule (250 mg total) by mouth 2 (two) times daily. 14 capsule 0  . TRULICITY 1.5 MG/0.5ML SOPN INJECT THE CONTENTS OF 1  PEN (1.5MG ) SUBCUTANEOUSLY  WEEKLY AS DIRECTED 6 mL 3  . doxycycline (VIBRA-TABS) 100 MG tablet Take 1 tablet (100 mg total) by mouth 2 (two) times daily. 14 tablet 0   No facility-administered medications prior to visit.    Allergies  Allergen Reactions  . Metformin And Related Diarrhea and Other (See Comments)    Diarrhea and Stomach Pain  . Atorvastatin Other (See Comments)    Stomach Pain Other reaction(s): Other (See Comments) Stomach Pain  . Invokana [Canagliflozin] Rash  . Lisinopril Cough    Review of Systems Pertinent positives as noted in history of present illness otherwise negative.   Objective:    Physical Exam Vitals reviewed.  Constitutional:      Appearance: She is obese.  HENT:     Head: Normocephalic.  Cardiovascular:     Rate and Rhythm: Normal rate.     Pulses: Normal pulses.  Musculoskeletal:        General: Normal range of motion.     Cervical back: Normal range of motion.  Skin:    General: Skin is warm and dry.     Comments: Rt breast abscess is healing well with no induration, redess, drainage, and small open area healing  with good granulation. There is redness where the Band-Aid adhesive is irritating the skin in the shape of the Band-Aid. She has a small gauze pad under the Band-Aid and it is clean.     Neurological:     Mental Status: She is alert.     BP 130/84 (BP Location: Left Arm, Patient Position: Sitting, Cuff Size: Large)   Pulse (!) 107  Temp 98.5 F (36.9 C) (Oral)   Ht 5\' 10"  (1.778 m)   Wt 262 lb (118.8 kg)   SpO2 99%   BMI 37.59 kg/m  Wt Readings from Last 3 Encounters:  04/06/20 262 lb (118.8 kg)  03/23/20 264 lb (119.7 kg)  03/16/20 264 lb (119.7 kg)   Pulse Readings from Last 3 Encounters:  04/06/20 (!) 107  03/23/20 96  03/16/20 (!) 105    BP Readings from Last 3 Encounters:  04/06/20 130/84  03/23/20 124/86  03/16/20 (!) 152/94    Lab Results  Component Value Date   CHOL 179 08/07/2016   HDL 54 08/07/2016   LDLCALC 102 (H) 08/07/2016   LDLDIRECT 48 12/16/2016   TRIG 117 08/07/2016   CHOLHDL 3.3 08/07/2016      Health Maintenance Due  Topic Date Due  . Hepatitis C Screening  Never done  . FOOT EXAM  08/07/2017  . HEMOGLOBIN A1C  10/14/2017  . INFLUENZA VACCINE  01/30/2020  . Fecal DNA (Cologuard)  03/21/2020    There are no preventive care reminders to display for this patient.  Lab Results  Component Value Date   TSH 2.780 04/15/2017   Lab Results  Component Value Date   WBC 7.9 04/15/2017   HGB 12.9 04/15/2017   HCT 40.3 04/15/2017   MCV 84 04/15/2017   PLT 314 04/15/2017   Lab Results  Component Value Date   NA 142 04/15/2017   K 4.4 04/15/2017   CO2 23 04/15/2017   GLUCOSE 152 (H) 04/15/2017   BUN 10 04/15/2017   CREATININE 1.02 (H) 04/15/2017   BILITOT 0.3 04/15/2017   ALKPHOS 89 04/15/2017   AST 27 04/15/2017   ALT 31 04/15/2017   PROT 7.7 04/15/2017   ALBUMIN 4.6 04/15/2017   CALCIUM 10.2 04/15/2017   Lab Results  Component Value Date   CHOL 179 08/07/2016   Lab Results  Component Value Date   HDL 54 08/07/2016   Lab  Results  Component Value Date   LDLCALC 102 (H) 08/07/2016   Lab Results  Component Value Date   TRIG 117 08/07/2016   Lab Results  Component Value Date   CHOLHDL 3.3 08/07/2016   Lab Results  Component Value Date   HGBA1C 6.4 (H) 04/15/2017      Assessment & Plan:   Problem List Items Addressed This Visit      Other   Abscess of right breast - Primary    Your breast infected blackhead abscess is healing great after the I & D and Doxy.   You have  adhesive tape irritation from the Band-Aid. Please stop using the Band-Aid.  Use a gauze pad and paper tape instead. The site looks great.  No sign of infection.  No need for repeat antibiotics.  Follow up as needed. Monitor closely for any signs of poor healing,  redness, pain, warmth or drainage  No orders of the defined types were placed in this encounter.   Follow-up: No follow-ups on file.  This visit occurred during the SARS-CoV-2 public health emergency.  Safety protocols were in place, including screening questions prior to the visit, additional usage of staff PPE, and extensive cleaning of exam room while observing appropriate contact time as indicated for disinfecting solutions.    04/17/2017, NP

## 2020-04-06 NOTE — Patient Instructions (Signed)
Your breast infected blackhead abscess is healing great after the I & D and Doxy.   You have  adhesive tape irritation from the Band-Aid. Please stop using the Band-Aid.  Use a gauze pad and paper tape instead. The site looks great.  No sign of infection.  No need for repeat antibiotics.  Follow up as needed. Monitor closely for any signs of poor healing,  redness, pain, warmth or drainage.

## 2020-04-08 ENCOUNTER — Encounter: Payer: Self-pay | Admitting: Nurse Practitioner

## 2020-05-15 ENCOUNTER — Other Ambulatory Visit: Payer: Self-pay | Admitting: Family Medicine

## 2020-11-09 ENCOUNTER — Other Ambulatory Visit: Payer: Self-pay

## 2020-11-15 ENCOUNTER — Ambulatory Visit: Payer: 59 | Admitting: Family Medicine

## 2020-11-15 ENCOUNTER — Other Ambulatory Visit: Payer: Self-pay

## 2020-11-15 ENCOUNTER — Encounter: Payer: Self-pay | Admitting: Family Medicine

## 2020-11-15 VITALS — BP 125/70 | HR 95 | Temp 98.3°F | Ht 70.0 in | Wt 258.2 lb

## 2020-11-15 DIAGNOSIS — R232 Flushing: Secondary | ICD-10-CM | POA: Diagnosis not present

## 2020-11-15 DIAGNOSIS — E119 Type 2 diabetes mellitus without complications: Secondary | ICD-10-CM

## 2020-11-15 DIAGNOSIS — L729 Follicular cyst of the skin and subcutaneous tissue, unspecified: Secondary | ICD-10-CM

## 2020-11-15 DIAGNOSIS — I10 Essential (primary) hypertension: Secondary | ICD-10-CM

## 2020-11-15 DIAGNOSIS — Z1231 Encounter for screening mammogram for malignant neoplasm of breast: Secondary | ICD-10-CM

## 2020-11-15 DIAGNOSIS — E785 Hyperlipidemia, unspecified: Secondary | ICD-10-CM

## 2020-11-15 DIAGNOSIS — Z1211 Encounter for screening for malignant neoplasm of colon: Secondary | ICD-10-CM

## 2020-11-15 MED ORDER — VALSARTAN 160 MG PO TABS: 160.0000 mg | ORAL_TABLET | Freq: Every day | ORAL | 3 refills | Status: DC

## 2020-11-15 MED ORDER — GABAPENTIN 100 MG PO CAPS: 100.0000 mg | ORAL_CAPSULE | Freq: Three times a day (TID) | ORAL | 3 refills | Status: DC

## 2020-11-15 NOTE — Assessment & Plan Note (Signed)
Chronic issues with this.  Prior [facility] was in the postmenopausal range.  She has been on Lexapro.  We will trial adding gabapentin to see if that is beneficial.  Discussed risk of drowsiness with this.  If she is excessively drowsy she will not drive and she will let us know.

## 2020-11-15 NOTE — Assessment & Plan Note (Signed)
Poorly controlled at home.  We will add valsartan 160 mg once daily.  She will continue amlodipine 5 mg once daily.  Check labs today.  Check labs in 7 to 10 days as well.  Follow-up in 1 month.

## 2020-11-15 NOTE — Assessment & Plan Note (Addendum)
The patient will call the general surgery office to schedule follow-up given persistent cyst.

## 2020-11-15 NOTE — Progress Notes (Signed)
Tommi Rumps, MD Phone: [phone]  Rebecca Hobbs is a 55 y.o. female who presents today for f/u.  DIABETES Disease Monitoring: Blood Sugar ranges-100-130 on medication, was up to the 829F off of trulicity Polyuria/phagia/dipsia- no      Optho- due Medications: Compliance- taking trulicity, glimeperide Hypoglycemic symptoms- no  HYPERTENSION  Disease Monitoring  Home BP Monitoring 140/80-90 Chest pain- no    Dyspnea- no Medications  Compliance-  Taking amlodipine.   Edema- no  Hyperlipidemia: She has not been on her pravastatin.  Hot flashes: She continues to have hot flashes that occur throughout the day and night.  She is taking a final estrogen over-the-counter notes that has been somewhat beneficial.  She is status post hysterectomy.  She does still have her ovaries.  She reports history of pseudotumor cerebri on oral contraceptives.  No family history of breast cancer.  Sebaceous cyst right breast: She notes there is still a small opening and it does occasionally drain fluid.  This was treated by general surgery late last year.  Colon cancer screening: Patient is due for Cologuard.  She denies rectal bleeding.  No family history of colon cancer.   Social History   Tobacco Use  Smoking Status Never Smoker  Smokeless Tobacco Never Used    Current Outpatient Medications on File Prior to Visit  Medication Sig Dispense Refill  . amLODipine (NORVASC) 5 MG tablet TAKE 1 TABLET BY MOUTH  DAILY 90 tablet 3  . escitalopram (LEXAPRO) 10 MG tablet TAKE 1 TABLET BY MOUTH  DAILY 30 tablet 11  . glimepiride (AMARYL) 4 MG tablet Take 1 tablet (4 mg total) by mouth daily with breakfast. 90 tablet 3  . glucose blood (ONETOUCH VERIO) test strip Use as instructed for CBG testing twice daily. E11.9. Quantity Sufficient for 90 day supply 200 each 3  . ketoconazole (NIZORAL) 2 % cream Apply 1 application topically daily. 15 g 0  . Multiple Vitamin (MULTIVITAMIN) tablet Take 1 tablet by  mouth daily.    Marland Kitchen saccharomyces boulardii (FLORASTOR) 250 MG capsule Take 1 capsule (250 mg total) by mouth 2 (two) times daily. 14 capsule 0  . TRULICITY 1.5 AO/1.3YQ SOPN INJECT THE CONTENTS OF 1  PEN (1.5MG) SUBCUTANEOUSLY  WEEKLY AS DIRECTED 6 mL 3   No current facility-administered medications on file prior to visit.     ROS see history of present illness  Objective  Physical Exam Vitals:   11/15/20 0833  BP: 125/70  Pulse: 95  Temp: 98.3 F (36.8 C)  SpO2: 99%    BP Readings from Last 3 Encounters:  11/15/20 125/70  04/06/20 130/84  03/23/20 124/86   Wt Readings from Last 3 Encounters:  11/15/20 258 lb 3.2 oz (117.1 kg)  04/06/20 262 lb (118.8 kg)  03/23/20 264 lb (119.7 kg)    Physical Exam Constitutional:      General: She is not in acute distress.    Appearance: She is not diaphoretic.  Cardiovascular:     Rate and Rhythm: Normal rate and regular rhythm.     Heart sounds: Normal heart sounds.  Pulmonary:     Effort: Pulmonary effort is normal.     Breath sounds: Normal breath sounds.  Chest:    Skin:    General: Skin is warm and dry.  Neurological:     Mental Status: She is alert.      Assessment/Plan: Please see individual problem list.  Problem List Items Addressed This Visit    HTN (hypertension) -  Primary    Poorly controlled at home.  We will add valsartan 160 mg once daily.  She will continue amlodipine 5 mg once daily.  Check labs today.  Check labs in 7 to 10 days as well.  Follow-up in 1 month.      Relevant Medications   valsartan (DIOVAN) 160 MG tablet   Other Relevant Orders   Comp Met (CMET)   Basic Metabolic Panel (BMET)   DM type 2 (diabetes mellitus, type 2) (HCC)    Check A1c.  She will continue Trulicity 1.5 mg once weekly and glimepiride 4 mg once daily.  Discussed the risk of hypoglycemia with glimepiride.      Relevant Medications   valsartan (DIOVAN) 160 MG tablet   Other Relevant Orders   HgB A1c   Cutaneous  cyst    The patient will call the general surgery office to schedule follow-up given persistent cyst.      Hot flashes    Chronic issues with this.  Prior [facility] was in the postmenopausal range.  She has been on Lexapro.  We will trial adding gabapentin to see if that is beneficial.  Discussed risk of drowsiness with this.  If she is excessively drowsy she will not drive and she will let us know.      Relevant Medications   gabapentin (NEURONTIN) 100 MG capsule   valsartan (DIOVAN) 160 MG tablet   Hyperlipidemia    Check lipid panel.  Consider starting statin once lipid panel returns.      Relevant Medications   valsartan (DIOVAN) 160 MG tablet   Other Relevant Orders   Lipid panel    Other Visit Diagnoses    Encounter for screening mammogram for malignant neoplasm of breast       Relevant Orders   MM 3D SCREEN BREAST BILATERAL   Colon cancer screening       Relevant Orders   Cologuard       Health Maintenance: The patient will call to schedule her mammogram.  Cologuard will be completed.  I did discuss the option of completing a colonoscopy though she defers this.  Return in about 2 weeks (around 11/29/2020) for Labs, 6 weeks month PCP.  This visit occurred during the SARS-CoV-2 public health emergency.  Safety protocols were in place, including screening questions prior to the visit, additional usage of staff PPE, and extensive cleaning of exam room while observing appropriate contact time as indicated for disinfecting solutions.    Tommi Rumps, MD [place]

## 2020-11-15 NOTE — Assessment & Plan Note (Signed)
Check lipid panel.  Consider starting statin once lipid panel returns.

## 2020-11-15 NOTE — Assessment & Plan Note (Signed)
Check A1c.  She will continue Trulicity 1.5 mg once weekly and glimepiride 4 mg once daily.  Discussed the risk of hypoglycemia with glimepiride.

## 2020-11-15 NOTE — Patient Instructions (Addendum)
Nice to see you. Please contact [facility] general surgery to schedule follow-up for the cyst in your breast. We will start on gabapentin to see if that helps with your hot flashes.  If this makes you drowsy please do not drive and please let us know. We will start valsartan for your blood pressure.  You will need labs 7 to 10 days after starting this. We will contact you with your lab results. Please call [phone] to schedule your mammogram.

## 2020-11-16 LAB — COMPREHENSIVE METABOLIC PANEL
ALT: 21 IU/L (ref 0–32)
AST: 18 IU/L (ref 0–40)
Albumin/Globulin Ratio: 1.6 (ref 1.2–2.2)
Albumin: 4.6 g/dL (ref 3.8–4.9)
Alkaline Phosphatase: 88 IU/L (ref 44–121)
BUN/Creatinine Ratio: 9 (ref 9–23)
BUN: 8 mg/dL (ref 6–24)
Bilirubin Total: 0.5 mg/dL (ref 0.0–1.2)
CO2: 19 mmol/L — ABNORMAL LOW (ref 20–29)
Calcium: 9.6 mg/dL (ref 8.7–10.2)
Chloride: 102 mmol/L (ref 96–106)
Creatinine, Ser: 0.88 mg/dL (ref 0.57–1.00)
Globulin, Total: 2.9 g/dL (ref 1.5–4.5)
Glucose: 132 mg/dL — ABNORMAL HIGH (ref 65–99)
Potassium: 4.2 mmol/L (ref 3.5–5.2)
Sodium: 138 mmol/L (ref 134–144)
Total Protein: 7.5 g/dL (ref 6.0–8.5)
eGFR: 79 mL/min/{1.73_m2} (ref >59)

## 2020-11-16 LAB — LIPID PANEL
Chol/HDL Ratio: 3.9 ratio (ref 0.0–4.4)
Cholesterol, Total: 204 mg/dL — ABNORMAL HIGH (ref 100–199)
HDL: 52 mg/dL (ref >39)
LDL Chol Calc (NIH): 129 mg/dL — ABNORMAL HIGH (ref 0–99)
Triglycerides: 128 mg/dL (ref 0–149)
VLDL Cholesterol Cal: 23 mg/dL (ref 5–40)

## 2020-11-16 LAB — HEMOGLOBIN A1C
Est. average glucose Bld gHb Est-mCnc: 237 mg/dL
Hgb A1c MFr Bld: 9.9 % — ABNORMAL HIGH (ref 4.8–5.6)

## 2020-11-29 ENCOUNTER — Other Ambulatory Visit (INDEPENDENT_AMBULATORY_CARE_PROVIDER_SITE_OTHER): Payer: 59

## 2020-11-29 ENCOUNTER — Other Ambulatory Visit: Payer: Self-pay

## 2020-11-29 DIAGNOSIS — I10 Essential (primary) hypertension: Secondary | ICD-10-CM | POA: Diagnosis not present

## 2020-11-30 LAB — BASIC METABOLIC PANEL
BUN/Creatinine Ratio: 13 (ref 9–23)
BUN: 9 mg/dL (ref 6–24)
CO2: 17 mmol/L — ABNORMAL LOW (ref 20–29)
Calcium: 9.5 mg/dL (ref 8.7–10.2)
Chloride: 106 mmol/L (ref 96–106)
Creatinine, Ser: 0.68 mg/dL (ref 0.57–1.00)
Glucose: 136 mg/dL — ABNORMAL HIGH (ref 65–99)
Potassium: 4.5 mmol/L (ref 3.5–5.2)
Sodium: 143 mmol/L (ref 134–144)
eGFR: 104 mL/min/{1.73_m2} (ref >59)

## 2020-12-01 ENCOUNTER — Other Ambulatory Visit: Payer: Self-pay | Admitting: Family Medicine

## 2020-12-01 DIAGNOSIS — E878 Other disorders of electrolyte and fluid balance, not elsewhere classified: Secondary | ICD-10-CM

## 2020-12-01 DIAGNOSIS — E785 Hyperlipidemia, unspecified: Secondary | ICD-10-CM

## 2020-12-01 MED ORDER — ROSUVASTATIN CALCIUM 40 MG PO TABS: 40.0000 mg | ORAL_TABLET | Freq: Every day | ORAL | 3 refills | Status: DC

## 2020-12-06 ENCOUNTER — Encounter: Payer: Self-pay | Admitting: Family Medicine

## 2020-12-07 ENCOUNTER — Other Ambulatory Visit: Payer: Self-pay

## 2020-12-07 NOTE — Telephone Encounter (Signed)
Can you call the patient and see what this is regarding? I can not find where we talked to her about starting calcium for her bicarb level. I wanted it rechecked this week though it looks like it is scheduled for next week. Can you see if she can come in and have labs today or tomorrow? This needs to be rechecked this week.

## 2020-12-08 ENCOUNTER — Other Ambulatory Visit: Payer: Self-pay

## 2020-12-08 ENCOUNTER — Other Ambulatory Visit (INDEPENDENT_AMBULATORY_CARE_PROVIDER_SITE_OTHER): Payer: 59

## 2020-12-08 DIAGNOSIS — E785 Hyperlipidemia, unspecified: Secondary | ICD-10-CM

## 2020-12-08 DIAGNOSIS — E878 Other disorders of electrolyte and fluid balance, not elsewhere classified: Secondary | ICD-10-CM

## 2020-12-09 LAB — BASIC METABOLIC PANEL
BUN/Creatinine Ratio: 8 — ABNORMAL LOW (ref 9–23)
BUN: 7 mg/dL (ref 6–24)
CO2: 24 mmol/L (ref 20–29)
Calcium: 9.7 mg/dL (ref 8.7–10.2)
Chloride: 100 mmol/L (ref 96–106)
Creatinine, Ser: 0.85 mg/dL (ref 0.57–1.00)
Glucose: 137 mg/dL — ABNORMAL HIGH (ref 65–99)
Potassium: 4.7 mmol/L (ref 3.5–5.2)
Sodium: 140 mmol/L (ref 134–144)
eGFR: 82 mL/min/{1.73_m2} (ref >59)

## 2020-12-09 LAB — HEPATIC FUNCTION PANEL
ALT: 26 IU/L (ref 0–32)
AST: 19 IU/L (ref 0–40)
Albumin: 4.6 g/dL (ref 3.8–4.9)
Alkaline Phosphatase: 96 IU/L (ref 44–121)
Bilirubin Total: 0.3 mg/dL (ref 0.0–1.2)
Bilirubin, Direct: 0.1 mg/dL (ref 0.00–0.40)
Total Protein: 7.3 g/dL (ref 6.0–8.5)

## 2020-12-09 LAB — LDL CHOLESTEROL, DIRECT: LDL Direct: 72 mg/dL (ref 0–99)

## 2020-12-14 ENCOUNTER — Other Ambulatory Visit: Payer: 59

## 2020-12-27 ENCOUNTER — Other Ambulatory Visit: Payer: Self-pay

## 2020-12-27 ENCOUNTER — Ambulatory Visit (INDEPENDENT_AMBULATORY_CARE_PROVIDER_SITE_OTHER): Payer: 59 | Admitting: Family Medicine

## 2020-12-27 ENCOUNTER — Encounter: Payer: Self-pay | Admitting: Family Medicine

## 2020-12-27 VITALS — BP 120/90 | HR 96 | Temp 97.5°F | Ht 70.0 in | Wt 263.8 lb

## 2020-12-27 DIAGNOSIS — F32A Depression, unspecified: Secondary | ICD-10-CM | POA: Insufficient documentation

## 2020-12-27 DIAGNOSIS — E785 Hyperlipidemia, unspecified: Secondary | ICD-10-CM

## 2020-12-27 DIAGNOSIS — F419 Anxiety disorder, unspecified: Secondary | ICD-10-CM | POA: Insufficient documentation

## 2020-12-27 DIAGNOSIS — F39 Unspecified mood [affective] disorder: Secondary | ICD-10-CM | POA: Insufficient documentation

## 2020-12-27 DIAGNOSIS — R232 Flushing: Secondary | ICD-10-CM

## 2020-12-27 DIAGNOSIS — Z1231 Encounter for screening mammogram for malignant neoplasm of breast: Secondary | ICD-10-CM

## 2020-12-27 DIAGNOSIS — E119 Type 2 diabetes mellitus without complications: Secondary | ICD-10-CM

## 2020-12-27 DIAGNOSIS — I10 Essential (primary) hypertension: Secondary | ICD-10-CM

## 2020-12-27 DIAGNOSIS — Z1211 Encounter for screening for malignant neoplasm of colon: Secondary | ICD-10-CM

## 2020-12-27 LAB — HEPATIC FUNCTION PANEL
ALT: 47 U/L — ABNORMAL HIGH (ref 0–35)
AST: 34 U/L (ref 0–37)
Albumin: 4.6 g/dL (ref 3.5–5.2)
Alkaline Phosphatase: 97 U/L (ref 39–117)
Bilirubin, Direct: 0.1 mg/dL (ref 0.0–0.3)
Total Bilirubin: 0.4 mg/dL (ref 0.2–1.2)
Total Protein: 7.6 g/dL (ref 6.0–8.3)

## 2020-12-27 LAB — LDL CHOLESTEROL, DIRECT: Direct LDL: 35 mg/dL

## 2020-12-27 MED ORDER — AMLODIPINE BESYLATE 10 MG PO TABS: 10.0000 mg | ORAL_TABLET | Freq: Every day | ORAL | 1 refills | Status: DC

## 2020-12-27 MED ORDER — GABAPENTIN 100 MG PO CAPS: ORAL_CAPSULE | ORAL | 1 refills | Status: DC

## 2020-12-27 NOTE — Assessment & Plan Note (Signed)
Labs need to be rechecked

## 2020-12-27 NOTE — Assessment & Plan Note (Signed)
>>  ASSESSMENT AND PLAN FOR ANXIETY AND DEPRESSION WRITTEN ON 12/27/2020 10:26 AM BY SONNENBERG, ERIC G, MD  Improved and somewhat mild at this point.  Discussed taking the Lexapro  on a daily basis to see if that improves her anxiety further.

## 2020-12-27 NOTE — Assessment & Plan Note (Signed)
Still uncontrolled.  Will increase amlodipine to 10 mg once daily.  She will continue valsartan 160 mg daily.  Follow-up in 6 weeks.

## 2020-12-27 NOTE — Progress Notes (Signed)
Marikay Alar, MD Phone: [phone]  Rebecca Hobbs is a 55 y.o. female who presents today for f/u.  DIABETES Disease Monitoring: Blood Sugar ranges-130-140, one time 300 with no obvious cause Polyuria/phagia/dipsia- no      Optho- due Medications: Compliance- taking trulicity, glimeperide Hypoglycemic symptoms- no  HYPERTENSION Disease Monitoring Home BP Monitoring not checking Chest pain- no    Dyspnea- no Medications Compliance-  taking amlodipine, valsartan.  Edema- no  Flashes: Patient continues to have issues with hot flashes that build up with warmth centrally and spread out and then has some sweating with this.  These occur during the day and at nighttime though are bothering her more at night now.  She feels as though the gabapentin 100 mg 3 times daily has not been terribly beneficial.  Anxiety: Patient has a history of this though it is significantly better than it was in the past.  She takes Lexapro on an as-needed basis when she has excessive stress.  She does note a nervous sensation in her stomach that wakes her up at times at night.  Denies any palpitations and abdominal pain with this.    Social History   Tobacco Use  Smoking Status Never  Smokeless Tobacco Never    Current Outpatient Medications on File Prior to Visit  Medication Sig Dispense Refill   escitalopram (LEXAPRO) 10 MG tablet TAKE 1 TABLET BY MOUTH  DAILY 30 tablet 11   glimepiride (AMARYL) 4 MG tablet Take 1 tablet (4 mg total) by mouth daily with breakfast. 90 tablet 3   glucose blood (ONETOUCH VERIO) test strip Use as instructed for CBG testing twice daily. E11.9. Quantity Sufficient for 90 day supply 200 each 3   rosuvastatin (CRESTOR) 40 MG tablet Take 1 tablet (40 mg total) by mouth daily. 90 tablet 3   saccharomyces boulardii (FLORASTOR) 250 MG capsule Take 1 capsule (250 mg total) by mouth 2 (two) times daily. 14 capsule 0   TRULICITY 1.5 MG/0.5ML SOPN INJECT THE CONTENTS OF 1  PEN  (1.5MG ) SUBCUTANEOUSLY  WEEKLY AS DIRECTED 6 mL 3   valsartan (DIOVAN) 160 MG tablet Take 1 tablet (160 mg total) by mouth daily. 90 tablet 3   ketoconazole (NIZORAL) 2 % cream Apply 1 application topically daily. (Patient not taking: Reported on 12/27/2020) 15 g 0   Multiple Vitamin (MULTIVITAMIN) tablet Take 1 tablet by mouth daily. (Patient not taking: Reported on 12/27/2020)     No current facility-administered medications on file prior to visit.     ROS see history of present illness  Objective  Physical Exam Vitals:   12/27/20 1002 12/27/20 1022  BP: 130/90 120/90  Pulse: 96   Temp: (!) 97.5 F (36.4 C)   SpO2: 97%     BP Readings from Last 3 Encounters:  12/27/20 120/90  11/15/20 125/70  04/06/20 130/84   Wt Readings from Last 3 Encounters:  12/27/20 263 lb 12.8 oz (119.7 kg)  11/15/20 258 lb 3.2 oz (117.1 kg)  04/06/20 262 lb (118.8 kg)    Physical Exam Constitutional:      General: She is not in acute distress.    Appearance: She is not diaphoretic.  Cardiovascular:     Rate and Rhythm: Normal rate and regular rhythm.     Heart sounds: Normal heart sounds.  Pulmonary:     Effort: Pulmonary effort is normal.     Breath sounds: Normal breath sounds.  Skin:    General: Skin is warm and dry.  Neurological:  Mental Status: She is alert.     Assessment/Plan: Please see individual problem list.  Problem List Items Addressed This Visit     Anxiety    Improved and somewhat mild at this point.  Discussed taking the Lexapro on a daily basis to see if that improves her anxiety further.       DM type 2 (diabetes mellitus, type 2) (HCC)    Control seems to be improved based on home CBGs.  She will be due for an A1c in about 6 weeks.  She will continue Trulicity 1.5 mg once weekly and glimepiride 4 mg once daily with breakfast.  She will schedule an eye appointment.       Hot flashes - Primary    This continues to be an issue.  We will increase her  nighttime dose of gabapentin to 300 mg.  She will continue 100 mg in the morning and in the middle of the day of the gabapentin.  She will monitor for drowsiness.  If she is drowsy with this she will let us know.  If this is not effective within 1 to 2 weeks she will let us know.  I also discussed that starting the Lexapro daily may be beneficial for this as well.       Relevant Medications   amLODipine (NORVASC) 10 MG tablet   gabapentin (NEURONTIN) 100 MG capsule   HTN (hypertension)    Still uncontrolled.  Will increase amlodipine to 10 mg once daily.  She will continue valsartan 160 mg daily.  Follow-up in 6 weeks.       Relevant Medications   amLODipine (NORVASC) 10 MG tablet   Hyperlipidemia    Labs need to be rechecked.       Relevant Medications   amLODipine (NORVASC) 10 MG tablet   Other Relevant Orders   Direct LDL   Hepatic function panel   Other Visit Diagnoses     Essential hypertension       Relevant Medications   amLODipine (NORVASC) 10 MG tablet   Encounter for screening mammogram for malignant neoplasm of breast       Relevant Orders   MM 3D SCREEN BREAST BILATERAL   Colon cancer screening       Relevant Orders   Cologuard        Health Maintenance: The patient will call to schedule her mammogram.  Cologuard was ordered.  The patient is aware that if this is positive she will have to have a colonoscopy and her insurance may not pay for the colonoscopy as it is no longer screening test.  Return in about 6 weeks (around 02/07/2021) for DM.  This visit occurred during the SARS-CoV-2 public health emergency.  Safety protocols were in place, including screening questions prior to the visit, additional usage of staff PPE, and extensive cleaning of exam room while observing appropriate contact time as indicated for disinfecting solutions.    Marikay Alar, MD [facility]

## 2020-12-27 NOTE — Patient Instructions (Signed)
Nice to see you. The Cologuard should come to your house.  If you do not receive this please let us know. Please call [phone] for your mammogram to get scheduled. We will increase your amlodipine to 10 mg once daily to see if that helps with your blood pressure. Please increase the gabapentin to 300 mg at night.  You will continue the 100 mg in the morning and middle of the day.  If this makes you drowsy please let us know. Please take the Lexapro consistently. We will contact you with your lab results.

## 2020-12-27 NOTE — Assessment & Plan Note (Signed)
Improved and somewhat mild at this point.  Discussed taking the Lexapro on a daily basis to see if that improves her anxiety further.

## 2020-12-27 NOTE — Assessment & Plan Note (Signed)
Control seems to be improved based on home CBGs.  She will be due for an A1c in about 6 weeks.  She will continue Trulicity 1.5 mg once weekly and glimepiride 4 mg once daily with breakfast.  She will schedule an eye appointment.

## 2020-12-27 NOTE — Assessment & Plan Note (Addendum)
This continues to be an issue.  We will increase her nighttime dose of gabapentin to 300 mg.  She will continue 100 mg in the morning and in the middle of the day of the gabapentin.  She will monitor for drowsiness.  If she is drowsy with this she will let us know.  If this is not effective within 1 to 2 weeks she will let us know.  I also discussed that starting the Lexapro daily may be beneficial for this as well.

## 2020-12-30 ENCOUNTER — Other Ambulatory Visit: Payer: Self-pay | Admitting: Family Medicine

## 2020-12-30 DIAGNOSIS — R7989 Other specified abnormal findings of blood chemistry: Secondary | ICD-10-CM

## 2020-12-30 DIAGNOSIS — R945 Abnormal results of liver function studies: Secondary | ICD-10-CM

## 2021-01-12 ENCOUNTER — Other Ambulatory Visit (INDEPENDENT_AMBULATORY_CARE_PROVIDER_SITE_OTHER): Payer: 59

## 2021-01-12 ENCOUNTER — Other Ambulatory Visit: Payer: Self-pay

## 2021-01-12 DIAGNOSIS — R945 Abnormal results of liver function studies: Secondary | ICD-10-CM

## 2021-01-12 DIAGNOSIS — R7989 Other specified abnormal findings of blood chemistry: Secondary | ICD-10-CM

## 2021-01-13 LAB — HEPATIC FUNCTION PANEL
ALT: 81 IU/L — ABNORMAL HIGH (ref 0–32)
AST: 43 IU/L — ABNORMAL HIGH (ref 0–40)
Albumin: 4.3 g/dL (ref 3.8–4.9)
Alkaline Phosphatase: 117 IU/L (ref 44–121)
Bilirubin Total: 0.3 mg/dL (ref 0.0–1.2)
Bilirubin, Direct: 0.1 mg/dL (ref 0.00–0.40)
Total Protein: 7.1 g/dL (ref 6.0–8.5)

## 2021-01-21 ENCOUNTER — Other Ambulatory Visit: Payer: Self-pay | Admitting: Family Medicine

## 2021-01-21 DIAGNOSIS — R7989 Other specified abnormal findings of blood chemistry: Secondary | ICD-10-CM

## 2021-01-21 DIAGNOSIS — R945 Abnormal results of liver function studies: Secondary | ICD-10-CM

## 2021-02-07 ENCOUNTER — Other Ambulatory Visit: Payer: Self-pay

## 2021-02-07 ENCOUNTER — Ambulatory Visit: Payer: 59 | Admitting: Family Medicine

## 2021-02-07 ENCOUNTER — Encounter: Payer: Self-pay | Admitting: Family Medicine

## 2021-02-07 VITALS — BP 118/80 | HR 113 | Temp 97.6°F | Ht 70.0 in | Wt 262.0 lb

## 2021-02-07 DIAGNOSIS — E119 Type 2 diabetes mellitus without complications: Secondary | ICD-10-CM | POA: Diagnosis not present

## 2021-02-07 DIAGNOSIS — R945 Abnormal results of liver function studies: Secondary | ICD-10-CM | POA: Diagnosis not present

## 2021-02-07 DIAGNOSIS — M25561 Pain in right knee: Secondary | ICD-10-CM | POA: Insufficient documentation

## 2021-02-07 DIAGNOSIS — R7989 Other specified abnormal findings of blood chemistry: Secondary | ICD-10-CM | POA: Insufficient documentation

## 2021-02-07 DIAGNOSIS — I10 Essential (primary) hypertension: Secondary | ICD-10-CM

## 2021-02-07 DIAGNOSIS — Z1211 Encounter for screening for malignant neoplasm of colon: Secondary | ICD-10-CM

## 2021-02-07 NOTE — Assessment & Plan Note (Signed)
I suspect this is related to her Crestor.  We will recheck her liver enzymes today.

## 2021-02-07 NOTE — Assessment & Plan Note (Signed)
Adequately controlled.  She will continue valsartan 160 mg daily and amlodipine 10 mg daily.

## 2021-02-07 NOTE — Patient Instructions (Signed)
Nice to see you. We will let you know what your liver enzymes reveal. I ordered the Cologuard again.  If you do not receive this in the next 2 weeks please let me know. Please get me a copy of your lab results from your wellness screening. If your right knee does not continue to improve please let me know. Please call to schedule your mammogram.

## 2021-02-07 NOTE — Assessment & Plan Note (Signed)
She notes she will be having an A1c completed through a wellness screening with work.  She will get [place] copy of those labs later this week or next week.  She will continue glimepiride 4 mg daily with breakfast and Trulicity 1.5 mg weekly.  I advised that she should not miss any meals given the risk of hypoglycemia with glimepiride.  Discussed hypoglycemia protocol.

## 2021-02-07 NOTE — Progress Notes (Signed)
Marikay Alar, MD Phone: [phone]  Rebecca Hobbs is a 55 y.o. female who presents today for f/u.  HYPERTENSION Disease Monitoring Home BP Monitoring not checking Chest pain- no    Dyspnea- no Medications Compliance-  taking valsartan, amlodipine  Edema- no  DIABETES Disease Monitoring: Blood Sugar ranges-not checking Polyuria/phagia/dipsia- no      Optho- due Medications: Compliance- taking glimeperide (out for 5 days), trulicity Hypoglycemic symptoms- occasionally if she misses a meal, drinks something and improves.  Evaded LFTs: No right upper quadrant pain.  She stopped Crestor 2 to 3 weeks ago.  Right knee pain: Patient notes she stepped down her sisters stairs a week or so ago and felt a pull in the back of her knee.  She then had some swelling in the knee.  Notes progressive improvement of this.    Social History   Tobacco Use  Smoking Status Never  Smokeless Tobacco Never    Current Outpatient Medications on File Prior to Visit  Medication Sig Dispense Refill   amLODipine (NORVASC) 10 MG tablet Take 1 tablet (10 mg total) by mouth daily. 90 tablet 1   escitalopram (LEXAPRO) 10 MG tablet TAKE 1 TABLET BY MOUTH  DAILY 30 tablet 11   gabapentin (NEURONTIN) 100 MG capsule Take 1 tablet (100 mg) by mouth in the morning and in the middle of the day, then take 3 tablets (300 mg) by mouth at bedtime 450 capsule 1   glimepiride (AMARYL) 4 MG tablet Take 1 tablet (4 mg total) by mouth daily with breakfast. 90 tablet 3   glucose blood (ONETOUCH VERIO) test strip Use as instructed for CBG testing twice daily. E11.9. Quantity Sufficient for 90 day supply 200 each 3   ketoconazole (NIZORAL) 2 % cream Apply 1 application topically daily. 15 g 0   Multiple Vitamin (MULTIVITAMIN) tablet Take 1 tablet by mouth daily.     rosuvastatin (CRESTOR) 40 MG tablet Take 1 tablet (40 mg total) by mouth daily. 90 tablet 3   saccharomyces boulardii (FLORASTOR) 250 MG capsule Take 1 capsule  (250 mg total) by mouth 2 (two) times daily. 14 capsule 0   TRULICITY 1.5 MG/0.5ML SOPN INJECT THE CONTENTS OF 1  PEN (1.5MG ) SUBCUTANEOUSLY  WEEKLY AS DIRECTED 6 mL 3   valsartan (DIOVAN) 160 MG tablet Take 1 tablet (160 mg total) by mouth daily. 90 tablet 3   No current facility-administered medications on file prior to visit.     ROS see history of present illness  Objective  Physical Exam Vitals:   02/07/21 1012  BP: 118/80  Pulse: (!) 113  Temp: 97.6 F (36.4 C)  SpO2: 98%    BP Readings from Last 3 Encounters:  02/07/21 118/80  12/27/20 120/90  11/15/20 125/70   Wt Readings from Last 3 Encounters:  02/07/21 262 lb (118.8 kg)  12/27/20 263 lb 12.8 oz (119.7 kg)  11/15/20 258 lb 3.2 oz (117.1 kg)    Physical Exam Constitutional:      General: She is not in acute distress.    Appearance: She is not diaphoretic.  Cardiovascular:     Rate and Rhythm: Normal rate and regular rhythm.     Heart sounds: Normal heart sounds.  Pulmonary:     Effort: Pulmonary effort is normal.     Breath sounds: Normal breath sounds.  Musculoskeletal:     Comments: Right knee with no swelling, warmth, erythema, or tenderness.  Skin:    General: Skin is warm and dry.  Neurological:  Mental Status: She is alert.     Assessment/Plan: Please see individual problem list.  Problem List Items Addressed This Visit     DM type 2 (diabetes mellitus, type 2) (HCC)    She notes she will be having an A1c completed through a wellness screening with work.  She will get [place] copy of those labs later this week or next week.  She will continue glimepiride 4 mg daily with breakfast and Trulicity 1.5 mg weekly.  I advised that she should not miss any meals given the risk of hypoglycemia with glimepiride.  Discussed hypoglycemia protocol.       HTN (hypertension) - Primary    Adequately controlled.  She will continue valsartan 160 mg daily and amlodipine 10 mg daily.       LFTs abnormal    I  suspect this is related to her Crestor.  We will recheck her liver enzymes today.       Relevant Orders   Hepatic function panel   Right knee pain    This has been progressively improving.  I suspect it was a strain.  She will monitor for any worsening.       Other Visit Diagnoses     Colon cancer screening       Relevant Orders   Cologuard        Health Maintenance: Cologuard reordered.  She reports she had not received this yet.  She will call to schedule her mammogram.  Return in about 3 months (around 05/10/2021) for Diabetes.  This visit occurred during the SARS-CoV-2 public health emergency.  Safety protocols were in place, including screening questions prior to the visit, additional usage of staff PPE, and extensive cleaning of exam room while observing appropriate contact time as indicated for disinfecting solutions.    Marikay Alar, MD [facility]

## 2021-02-07 NOTE — Assessment & Plan Note (Signed)
This has been progressively improving.  I suspect it was a strain.  She will monitor for any worsening.

## 2021-02-08 ENCOUNTER — Telehealth: Payer: Self-pay | Admitting: Family Medicine

## 2021-02-08 LAB — HEPATIC FUNCTION PANEL
ALT: 26 IU/L (ref 0–32)
AST: 24 IU/L (ref 0–40)
Albumin: 4.8 g/dL (ref 3.8–4.9)
Alkaline Phosphatase: 95 IU/L (ref 44–121)
Bilirubin Total: 0.3 mg/dL (ref 0.0–1.2)
Bilirubin, Direct: <0.1 mg/dL (ref 0.00–0.40)
Total Protein: 8 g/dL (ref 6.0–8.5)

## 2021-02-08 NOTE — Telephone Encounter (Signed)
Called and spoke to [place]. See Result notes.

## 2021-02-08 NOTE — Telephone Encounter (Signed)
Patient returned office phone call for lab results. 

## 2021-02-09 ENCOUNTER — Encounter: Payer: Self-pay | Admitting: Family Medicine

## 2021-02-09 LAB — LIPID PANEL
Cholesterol: 208 — AB (ref 0–200)
HDL: 55 (ref 35–70)
LDL Cholesterol: 118
LDl/HDL Ratio: 3.8
Triglycerides: 201 — AB (ref 40–160)

## 2021-02-09 LAB — HEMOGLOBIN A1C: Hemoglobin A1C: 8.5

## 2021-02-09 LAB — BASIC METABOLIC PANEL
Creatinine: 0.9 (ref <=1.1)
Glucose: 107

## 2021-02-10 ENCOUNTER — Other Ambulatory Visit: Payer: Self-pay | Admitting: Family Medicine

## 2021-02-10 DIAGNOSIS — E785 Hyperlipidemia, unspecified: Secondary | ICD-10-CM

## 2021-02-10 MED ORDER — PRAVASTATIN SODIUM 20 MG PO TABS: 20.0000 mg | ORAL_TABLET | Freq: Every day | ORAL | 3 refills | Status: DC

## 2021-02-12 ENCOUNTER — Other Ambulatory Visit: Payer: 59

## 2021-02-13 ENCOUNTER — Encounter: Payer: Self-pay | Admitting: Family Medicine

## 2021-02-21 LAB — COLOGUARD
Cologuard: NEGATIVE
Cologuard: NEGATIVE

## 2021-02-22 ENCOUNTER — Telehealth: Payer: Self-pay | Admitting: Family Medicine

## 2021-02-22 DIAGNOSIS — E119 Type 2 diabetes mellitus without complications: Secondary | ICD-10-CM

## 2021-02-22 NOTE — Addendum Note (Signed)
Addended by: Glori Luis on: 02/22/2021 04:43 PM   Modules accepted: Orders

## 2021-02-22 NOTE — Telephone Encounter (Signed)
Patient return my call and is willing to see [name] about her diabetes management.  Manju Kulkarni,cma

## 2021-02-22 NOTE — Telephone Encounter (Signed)
-----   Message from Lourena Simmonds, RPH-CPP sent at 02/21/2021 11:57 AM EDT ----- Rebecca Hobbs, doesn't look like she's seen your MyChart message about increasing Trulicity.   Might also be a good candidate for [place]jaro?  Feel free to place a CCM referral if appropriate and interested.   [name]

## 2021-02-22 NOTE — Telephone Encounter (Signed)
Referral placed.

## 2021-02-22 NOTE — Telephone Encounter (Signed)
Can you reach out to the patient and see if she would like to meet with our clinical pharmacist to help manage her diabetes?  There is a different medicine that we might be able to try that may help with her diabetes even more than the Trulicity and I would like for her to discuss that with the pharmacist if she is willing.  Thanks.

## 2021-02-22 NOTE — Telephone Encounter (Signed)
Mailbox is full.  Ng

## 2021-02-23 ENCOUNTER — Telehealth: Payer: Self-pay

## 2021-02-23 NOTE — Chronic Care Management (AMB) (Signed)
  Care Management   Note  02/23/2021 Name: Rebecca Hobbs MRN: [id number] DOB: [birth year]  [name] is a 55 y.o. year old female who is a primary care patient of Birdie Sons, Yehuda Mao, MD. I reached out to [name] by phone today in response to a referral sent by [name] health plan.    [name] was given information about care management services today including:  Care management services include personalized support from designated clinical staff supervised by her physician, including individualized plan of care and coordination with other care providers 24/7 contact phone numbers for assistance for urgent and routine care needs. The patient may stop care management services at any time by phone call to the office staff.  Patient agreed to services and verbal consent obtained.   Follow up plan: Telephone appointment with care management team member scheduled for:03/01/2021  Penne Lash, RMA Care Guide, Embedded Care Coordination [facility]  [place], [place] [address] Direct Dial: [phone] [email or web address]: [email or web address]

## 2021-02-23 NOTE — Telephone Encounter (Signed)
The patient will have a phone call with the clinical pharmacist to discuss options for treatment.

## 2021-03-01 ENCOUNTER — Telehealth: Payer: Self-pay | Admitting: Pharmacist

## 2021-03-01 ENCOUNTER — Ambulatory Visit: Payer: 59 | Admitting: Pharmacist

## 2021-03-01 DIAGNOSIS — E669 Obesity, unspecified: Secondary | ICD-10-CM

## 2021-03-01 DIAGNOSIS — I10 Essential (primary) hypertension: Secondary | ICD-10-CM

## 2021-03-01 DIAGNOSIS — E119 Type 2 diabetes mellitus without complications: Secondary | ICD-10-CM

## 2021-03-01 MED ORDER — TIRZEPATIDE 2.5 MG/0.5ML ~~LOC~~ SOAJ: 2.5000 mg | SUBCUTANEOUS | 0 refills | Status: AC

## 2021-03-01 MED ORDER — TIRZEPATIDE 5 MG/0.5ML ~~LOC~~ SOAJ: 5.0000 mg | SUBCUTANEOUS | 2 refills | Status: DC

## 2021-03-01 NOTE — Patient Instructions (Signed)
Maanvi,   It was great talking to you today!  Stop Trulicity and glimepiride. Start Mounjaro 2.5 mg weekly for 4 weeks, then increase to 5 mg weekly. This medication may cause stomach upset, queasiness, or constipation, especially when first starting. This generally improves over time. Call our office if these symptoms occur and worsen, or if you have severe symptoms such as vomiting, diarrhea, or stomach pain.   Call me with any questions or concerns!  [name] Feliz Beam, PharmD [phone]    Visit Information  PATIENT GOALS:   Goals Addressed               This Visit's Progress     Patient Stated     Medication Monitoring (pt-stated)        Patient Goals/Self-Care Activities Over the next 90 days, patient will:  - take medications as prescribed target a minimum of 150 minutes of moderate intensity exercise weekly engage in dietary modifications by moderation of portion sizes        Ms. Akerley was given information about Care Management services by the embedded care coordination team including:  Care Management services include personalized support from designated clinical staff supervised by her physician, including individualized plan of care and coordination with other care providers 24/7 contact phone numbers for assistance for urgent and routine care needs. The patient may stop CCM services at any time (effective at the end of the month) by phone call to the office staff.  Patient agreed to services and verbal consent obtained.   Print copy of patient instructions, educational materials, and care plan provided in person.   Plan: Telephone follow up appointment with care management team member scheduled for:  ~ 6 weeks  [name] Feliz Beam, PharmD, [place], CPP Clinical Pharmacist [facility] at [employer] [phone]

## 2021-03-01 NOTE — Telephone Encounter (Signed)
Medication Samples have been labeled and logged for the patient.  Drug name: Greggory Keen       Strength: 2.5 mg        Qty: 1 box  LOT: [id number]  Exp.Date: 08/06/22  Dosing instructions: Inject 2.5 mg weekly for 4 weeks, then increase to 5 mg weekly  The patient has been instructed regarding the correct time, dose, and frequency of taking this medication, including desired effects and most common side effects.   Lourena Simmonds 1:38 PM 03/01/2021

## 2021-03-01 NOTE — Chronic Care Management (AMB) (Signed)
Care Management   Pharmacy Note  03/01/2021 Name: Rebecca Hobbs MRN: [id number] DOB: 11/13/1965  Subjective: Rebecca Hobbs is a 55 y.o. year old female who is a primary care patient of Birdie Sons, Yehuda Mao, MD. The Care Management team was consulted for assistance with care management and care coordination needs.    Engaged with patient by telephone for initial visit in response to provider referral for pharmacy case management and/or care coordination services.   The patient was given information about Care Management services today including:  Care Management services includes personalized support from designated clinical staff supervised by the patient's primary care provider, including individualized plan of care and coordination with other care providers. 24/7 contact phone numbers for assistance for urgent and routine care needs. The patient may stop case management services at any time by phone call to the office staff.  Patient agreed to services and consent obtained.  Assessment:  Review of patient status, including review of consultants reports, laboratory and other test data, was performed as part of comprehensive evaluation and provision of chronic care management services.   SDOH (Social Determinants of Health) assessments and interventions performed:    Objective:  Lab Results  Component Value Date   CREATININE 0.85 12/08/2020   CREATININE 0.68 11/29/2020   CREATININE 0.88 11/15/2020    Lab Results  Component Value Date   HGBA1C 9.9 (H) 11/15/2020       Component Value Date/Time   CHOL 204 (H) 11/15/2020 0904   TRIG 128 11/15/2020 0904   HDL 52 11/15/2020 0904   CHOLHDL 3.9 11/15/2020 0904   LDLCALC 129 (H) 11/15/2020 0904   LDLDIRECT 35.0 12/27/2020 1036     Clinical ASCVD: No  The 10-year ASCVD risk score Denman George DC Jr., et al., 2013) is: 8.7%   Values used to calculate the score:     Age: 47 years     Sex: Female     Is Non-Hispanic African American:  Yes     Diabetic: Yes     Tobacco smoker: No     Systolic Blood Pressure: 118 mmHg     Is BP treated: Yes     HDL Cholesterol: 52 mg/dL     Total Cholesterol: 204 mg/dL     BP Readings from Last 3 Encounters:  02/07/21 118/80  12/27/20 120/90  11/15/20 125/70    Care Plan  Allergies  Allergen Reactions   Metformin And Related Diarrhea and Other (See Comments)    Diarrhea and Stomach Pain   Atorvastatin Other (See Comments)    Stomach Pain Other reaction(s): Other (See Comments) Stomach Pain   Invokana [Canagliflozin] Rash   Lisinopril Cough    Medications Reviewed Today     Reviewed by Lourena Simmonds, RPH-CPP (Pharmacist) on 03/01/21 at 1118  Med List Status: <None>   Medication Order Taking? Sig Documenting Provider Last Dose Status Informant  amLODipine (NORVASC) 10 MG tablet [id number] Yes Take 1 tablet (10 mg total) by mouth daily. Glori Luis, MD Taking Active   escitalopram (LEXAPRO) 10 MG tablet [id number] Yes TAKE 1 TABLET BY MOUTH  DAILY Glori Luis, MD Taking Active   gabapentin (NEURONTIN) 100 MG capsule [id number] Yes Take 1 tablet (100 mg) by mouth in the morning and in the middle of the day, then take 3 tablets (300 mg) by mouth at bedtime Glori Luis, MD Taking Active   glimepiride (AMARYL) 4 MG tablet [id number] No Take 1 tablet (4 mg total) by mouth daily with  breakfast.  Patient not taking: Reported on 03/01/2021   Glori Luis, MD Not Taking Active   glucose blood [facility] VERIO) test strip [id number] Yes Use as instructed for CBG testing twice daily. E11.9. Quantity Sufficient for 90 day supply Glori Luis, MD Taking Active   Multiple Vitamin (MULTIVITAMIN) tablet [id number] Yes Take 1 tablet by mouth daily. [provider] Taking Active   pravastatin (PRAVACHOL) 20 MG tablet [id number] Yes Take 1 tablet (20 mg total) by mouth daily. Glori Luis, MD Taking Active   saccharomyces boulardii [facility]) 250 MG  capsule [id number] Yes Take 1 capsule (250 mg total) by mouth 2 (two) times daily. Theadore Nan, NP Taking Active   TRULICITY 1.5 MG/0.5ML Namon Cirri [id number] Yes INJECT THE CONTENTS OF 1  PEN (1.5MG ) SUBCUTANEOUSLY  WEEKLY AS DIRECTED Glori Luis, MD Taking Active   valsartan (DIOVAN) 160 MG tablet [id number] Yes Take 1 tablet (160 mg total) by mouth daily. Glori Luis, MD Taking Active   Med List Note Warden Fillers, [place] 09/06/16 1027): **[employer] EMPLOYEE**            Patient Active Problem List   Diagnosis Date Noted   LFTs abnormal 02/07/2021   Right knee pain 02/07/2021   Anxiety 12/27/2020   Hyperlipidemia 11/15/2020   Hot flashes 04/15/2017   Cutaneous cyst 02/04/2017   Epigastric pain 09/06/2016   Encounter for general adult medical examination with abnormal findings 08/07/2015   Neuritis of right ulnar nerve 08/07/2015   Obesity (BMI 30-39.9) 08/06/2014   DM type 2 (diabetes mellitus, type 2) (HCC) 09/08/2013   Microcalcifications of the breast 08/20/2013   HTN (hypertension) 07/08/2013    Conditions to be addressed/monitored: HTN, HLD, and DMII  Care Plan : Medication Management  Updates made by Lourena Simmonds, RPH-CPP since 03/01/2021 12:00 AM     Problem: Diabetes, HLD, HTN      Long-Range Goal: Disease Progression Prevention   Start Date: 03/01/2021  This Visit's Progress: On track  Priority: High  Note:   Current Barriers:  Unable to achieve control of diabetes   Pharmacist Clinical Goal(s):  Over the next 90 days, patient will achieve control of diabetes as evidenced by A1c  through collaboration with PharmD and provider.    Interventions: 1:1 collaboration with Glori Luis, MD regarding development and update of comprehensive plan of care as evidenced by provider attestation and co-signature Inter-disciplinary care team collaboration (see longitudinal plan of care) Comprehensive medication review performed; medication list  updated in electronic medical record  Diabetes: Uncontrolled; current treatment: Trulicity 1.5 mg, glimepiride 4 mg - has run out Hx metformin - IR and ER GI intolerability  Hx Invokana - rash, likely consider avoiding SGLT2 moving forward Hx Januvia  Current meal patterns: breakfast: sometimes skips, sometimes boiled egg, scrambled egg, toast, bacon; lunch: sometimes skips if she eats breakfast; sometimes [place] and cheese sandwich, chips; dinner: ground [place], fish, salmon, chicken, vegetables- green beans, peas, butterbeans; tries to avoid bread during the week but will have bread on Sunday family dinner; snacks: sometimes chocolate, cookie; drinks: trying to cut back on soft drinks, drinking more flavored water; water; sometimes V8 (low carb V8) Endorses episodes of hypoglycemia if she takes glimepiride and doesn't eat.  Current exercise: tries to walk, has exercise equipment in the house but doesn't use often.  Praised for focus on lean meats, vegetables, low carbs. Encouraged continued focus on portion sizes. Encouraged gradual increase in physical activity to target eventual  goal 150 minutes weekly Discussed change to [facility] given greater expected benefit on A1c, weight management. Discussed potential increased risk of GI upset. Counseled on potential side effects of nausea, stomach upset, queasiness, constipation, and that these generally improve over time. Advised to contact our office with more severe symptoms, including nausea, diarrhea, stomach pain. Patient verbalized understanding. Stop Trulicity. Start Mounarjo 2.5 mg weekly for 4 weeks, then increase to 5 mg weekly. Providing sample of 2.5 mg strength, script sent for 5 mg to pharmacy. Encouraged patient to let me know if access/coverage concerns with mail order and we can utilize manufacturer savings card program at [facility].  Stop glimepiride, though patient has not been taking.   Hypertension: Controlled per last office  visit; current treatment: valsartan 160 mg daily, amlodipine 10 mg daily;  Current home readings: did not discuss today Recommended to continue current regimen at this time. Will discuss benefits of home monitoring moving forward.   Hyperlipidemia: Uncontrolled; current treatment: pravastatin 20 mg daily - just started about a week ago Medications previously tried: rosuvastatin 40 mg daily - liver enzyme elevations, pravastatin 40 mg daily - unclear reason why patient discontinued; atorvastatin 40 mg - epigastric pain that resolved upon discontinuation Current dietary patterns: avoids red metas Assisted in scheduling lab appointment for hepatic function in ~ 6 weeks. If enzyme elevations with this dose of pravastatin, could consider lowest dose of rosuvastatin 5 mg daily. Hepatic enzyme elevations are generally dose dependent and rosuvastatin generally has a lower incidence than older statins. If not, consider ezetimibe for LDL lowering in primary prevention.   Depression/Anxiety: Moderately well managed; current treatment: escitalopram 10 mg daily;  Recommended to continue current regimen at this time  Peripheral Neuropathy: Controlled per patient report, current regimen: gabapentin 100 mg QAM, 100 mg midday, 300 mg QPM Recommended to continue current regimen at this time  Patient Goals/Self-Care Activities Over the next 90 days, patient will:  - take medications as prescribed target a minimum of 150 minutes of moderate intensity exercise weekly engage in dietary modifications by moderation of portion sizes  Follow Up Plan: Telephone follow up appointment with care management team member scheduled for: ~ 6 weeks      Medication Assistance:  None required.  Patient affirms current coverage meets needs.  Follow Up:  Patient agrees to Care Plan and Follow-up.  Plan: Telephone follow up appointment with care management team member scheduled for:  ~ 6 weeks  [name] Feliz Beam, PharmD,  [place], CPP Clinical Pharmacist [facility] [employer] [phone]

## 2021-03-02 NOTE — Telephone Encounter (Signed)
Patient picked up sample 

## 2021-03-06 ENCOUNTER — Ambulatory Visit: Payer: 59 | Admitting: Pharmacist

## 2021-03-06 DIAGNOSIS — E119 Type 2 diabetes mellitus without complications: Secondary | ICD-10-CM

## 2021-03-06 DIAGNOSIS — I10 Essential (primary) hypertension: Secondary | ICD-10-CM

## 2021-03-06 DIAGNOSIS — E669 Obesity, unspecified: Secondary | ICD-10-CM

## 2021-03-06 NOTE — Chronic Care Management (AMB) (Addendum)
Care Management   Pharmacy Note  03/06/2021 Name: Rebecca Hobbs MRN: [id number] DOB: Dec 19, 1965  Subjective: Rebecca Hobbs is a 55 y.o. year old female who is a primary care patient of Birdie Sons, Yehuda Mao, MD. The Care Management team was consulted for assistance with care management and care coordination needs.    Care coordination  for  medication access  in response to provider referral for pharmacy case management and/or care coordination services.   The patient was given information about Care Management services today including:  Care Management services includes personalized support from designated clinical staff supervised by the patient's primary care provider, including individualized plan of care and coordination with other care providers. 24/7 contact phone numbers for assistance for urgent and routine care needs. The patient may stop case management services at any time by phone call to the office staff.  Patient agreed to services and consent obtained.  Assessment:  Review of patient status, including review of consultants reports, laboratory and other test data, was performed as part of comprehensive evaluation and provision of chronic care management services.   SDOH (Social Determinants of Health) assessments and interventions performed:  SDOH Interventions    Flowsheet Row Most Recent Value  SDOH Interventions   Financial Strain Interventions Intervention Not Indicated        Objective:  Lab Results  Component Value Date   CREATININE 0.85 12/08/2020   CREATININE 0.68 11/29/2020   CREATININE 0.88 11/15/2020    Lab Results  Component Value Date   HGBA1C 9.9 (H) 11/15/2020       Component Value Date/Time   CHOL 204 (H) 11/15/2020 0904   TRIG 128 11/15/2020 0904   HDL 52 11/15/2020 0904   CHOLHDL 3.9 11/15/2020 0904   LDLCALC 129 (H) 11/15/2020 0904   LDLDIRECT 35.0 12/27/2020 1036    Clinical ASCVD: No  The 10-year ASCVD risk score Denman George DC Jr., et  al., 2013) is: 8.7%   Values used to calculate the score:     Age: 47 years     Sex: Female     Is Non-Hispanic African American: Yes     Diabetic: Yes     Tobacco smoker: No     Systolic Blood Pressure: 118 mmHg     Is BP treated: Yes     HDL Cholesterol: 52 mg/dL     Total Cholesterol: 204 mg/dL      BP Readings from Last 3 Encounters:  02/07/21 118/80  12/27/20 120/90  11/15/20 125/70    Care Plan  Allergies  Allergen Reactions   Metformin And Related Diarrhea and Other (See Comments)    Diarrhea and Stomach Pain   Atorvastatin Other (See Comments)    Stomach Pain Other reaction(s): Other (See Comments) Stomach Pain   Invokana [Canagliflozin] Rash   Lisinopril Cough    Medications Reviewed Today     Reviewed by Lourena Simmonds, RPH-CPP (Pharmacist) on 03/01/21 at 1118  Med List Status: <None>   Medication Order Taking? Sig Documenting Provider Last Dose Status Informant  amLODipine (NORVASC) 10 MG tablet [id number] Yes Take 1 tablet (10 mg total) by mouth daily. Glori Luis, MD Taking Active   escitalopram (LEXAPRO) 10 MG tablet [id number] Yes TAKE 1 TABLET BY MOUTH  DAILY Glori Luis, MD Taking Active   gabapentin (NEURONTIN) 100 MG capsule [id number] Yes Take 1 tablet (100 mg) by mouth in the morning and in the middle of the day, then take 3 tablets (300 mg) by mouth at  bedtime Glori Luis, MD Taking Active   glimepiride (AMARYL) 4 MG tablet [id number] No Take 1 tablet (4 mg total) by mouth daily with breakfast.  Patient not taking: Reported on 03/01/2021   Glori Luis, MD Not Taking Active   glucose blood [facility] VERIO) test strip [id number] Yes Use as instructed for CBG testing twice daily. E11.9. Quantity Sufficient for 90 day supply Glori Luis, MD Taking Active   Multiple Vitamin (MULTIVITAMIN) tablet [id number] Yes Take 1 tablet by mouth daily. [provider] Taking Active   pravastatin (PRAVACHOL) 20 MG tablet  [id number] Yes Take 1 tablet (20 mg total) by mouth daily. Glori Luis, MD Taking Active   saccharomyces boulardii [facility]) 250 MG capsule [id number] Yes Take 1 capsule (250 mg total) by mouth 2 (two) times daily. Theadore Nan, NP Taking Active   TRULICITY 1.5 MG/0.5ML Namon Cirri [id number] Yes INJECT THE CONTENTS OF 1  PEN (1.5MG ) SUBCUTANEOUSLY  WEEKLY AS DIRECTED Glori Luis, MD Taking Active   valsartan (DIOVAN) 160 MG tablet [id number] Yes Take 1 tablet (160 mg total) by mouth daily. Glori Luis, MD Taking Active   Med List Note Warden Fillers, [place] 09/06/16 1027): **[employer] EMPLOYEE**            Patient Active Problem List   Diagnosis Date Noted   LFTs abnormal 02/07/2021   Right knee pain 02/07/2021   Anxiety 12/27/2020   Hyperlipidemia 11/15/2020   Hot flashes 04/15/2017   Cutaneous cyst 02/04/2017   Epigastric pain 09/06/2016   Encounter for general adult medical examination with abnormal findings 08/07/2015   Neuritis of right ulnar nerve 08/07/2015   Obesity (BMI 30-39.9) 08/06/2014   DM type 2 (diabetes mellitus, type 2) (HCC) 09/08/2013   Microcalcifications of the breast 08/20/2013   HTN (hypertension) 07/08/2013    Conditions to be addressed/monitored: HTN, HLD, and DMII  Care Plan : Medication Management  Updates made by Lourena Simmonds, RPH-CPP since 03/06/2021 12:00 AM     Problem: Diabetes, HLD, HTN      Long-Range Goal: Disease Progression Prevention   Start Date: 03/01/2021  Recent Progress: On track  Priority: High  Note:   Current Barriers:  Unable to achieve control of diabetes   Pharmacist Clinical Goal(s):  Over the next 90 days, patient will achieve control of diabetes as evidenced by A1c  through collaboration with PharmD and provider.    Interventions: 1:1 collaboration with Glori Luis, MD regarding development and update of comprehensive plan of care as evidenced by provider attestation and  co-signature Inter-disciplinary care team collaboration (see longitudinal plan of care) Comprehensive medication review performed; medication list updated in electronic medical record  Diabetes: Uncontrolled; current treatment: Mounjaro 2.5 mg weekly Hx metformin - IR and ER GI intolerability  Hx Invokana - rash, likely consider avoiding SGLT2 moving forward Hx Januvia  Hx glimepiride Received PA request for Mounjaro 5 mg weekly. PA completed. APPROVED through 03/06/2022. Unsure if this will apply for all future strengths of Mounajaro  Hypertension: Controlled per last office visit; current treatment: valsartan 160 mg daily, amlodipine 10 mg daily;  Previously recommended to continue current regimen at this time. Will discuss benefits of home monitoring moving forward.   Hyperlipidemia: Uncontrolled; current treatment: pravastatin 20 mg daily - just started about a week ago; f/u lab results scheduled Medications previously tried: rosuvastatin 40 mg daily - liver enzyme elevations, pravastatin 40 mg daily - unclear reason why patient discontinued; atorvastatin 40 mg -  epigastric pain that resolved upon discontinuation Current dietary patterns: avoids red meats Previously recommended to continue current regimen at this time. Moving forward, if enzyme elevations with this dose of pravastatin, could consider lowest dose of rosuvastatin 5 mg daily. Hepatic enzyme elevations are generally dose dependent and rosuvastatin generally has a lower incidence than older statins. If not, consider ezetimibe for LDL lowering in primary prevention.   Depression/Anxiety: Moderately well managed; current treatment: escitalopram 10 mg daily;  Previously recommended to continue current regimen at this time  Peripheral Neuropathy: Controlled per patient report, current regimen: gabapentin 100 mg QAM, 100 mg midday, 300 mg QPM Previously recommended to continue current regimen at this time  Patient  Goals/Self-Care Activities Over the next 90 days, patient will:  - take medications as prescribed target a minimum of 150 minutes of moderate intensity exercise weekly engage in dietary modifications by moderation of portion sizes  Follow Up Plan: Telephone follow up appointment with care management team member scheduled for: ~ 6 weeks as previously scheduled      Medication Assistance:  None required.  Patient affirms current coverage meets needs.  Follow Up:  Patient agrees to Care Plan and Follow-up.  Plan: Telephone follow up appointment with care management team member scheduled for:  ~ 6 weeks as previously scheduled  [name] Feliz Beam, PharmD, [place], CPP Clinical Pharmacist [facility] at [employer] [phone]

## 2021-03-06 NOTE — Patient Instructions (Signed)
Visit Information   Goals Addressed               This Visit's Progress     Patient Stated     Medication Monitoring (pt-stated)        Patient Goals/Self-Care Activities Over the next 90 days, patient will:  - take medications as prescribed target a minimum of 150 minutes of moderate intensity exercise weekly engage in dietary modifications by moderation of portion sizes         Patient verbalizes understanding of instructions provided today and agrees to view in MyChart.    Plan: Telephone follow up appointment with care management team member scheduled for:  ~ 6 weeks as previously scheduled  [name] Feliz Beam, PharmD, [place], CPP Clinical Pharmacist [facility] [employer] [phone]

## 2021-04-05 ENCOUNTER — Other Ambulatory Visit: Payer: Self-pay

## 2021-04-05 ENCOUNTER — Other Ambulatory Visit (INDEPENDENT_AMBULATORY_CARE_PROVIDER_SITE_OTHER): Payer: 59

## 2021-04-05 DIAGNOSIS — E785 Hyperlipidemia, unspecified: Secondary | ICD-10-CM | POA: Diagnosis not present

## 2021-04-05 LAB — HM DIABETES EYE EXAM

## 2021-04-06 LAB — LDL CHOLESTEROL, DIRECT: LDL Direct: 67 mg/dL (ref 0–99)

## 2021-04-06 LAB — HEPATIC FUNCTION PANEL
ALT: 23 IU/L (ref 0–32)
AST: 20 IU/L (ref 0–40)
Albumin: 4.6 g/dL (ref 3.8–4.9)
Alkaline Phosphatase: 90 IU/L (ref 44–121)
Bilirubin Total: 0.3 mg/dL (ref 0.0–1.2)
Bilirubin, Direct: 0.12 mg/dL (ref 0.00–0.40)
Total Protein: 7.5 g/dL (ref 6.0–8.5)

## 2021-04-18 ENCOUNTER — Other Ambulatory Visit: Payer: Self-pay

## 2021-04-18 ENCOUNTER — Ambulatory Visit
Admission: RE | Admit: 2021-04-18 | Discharge: 2021-04-18 | Disposition: A | Discharge status: Home / Self Care | Payer: 59 | Source: Ambulatory Visit | Attending: Family Medicine | Admitting: Family Medicine

## 2021-04-18 DIAGNOSIS — Z1231 Encounter for screening mammogram for malignant neoplasm of breast: Secondary | ICD-10-CM

## 2021-04-19 ENCOUNTER — Ambulatory Visit: Payer: 59 | Admitting: Pharmacist

## 2021-04-19 DIAGNOSIS — E119 Type 2 diabetes mellitus without complications: Secondary | ICD-10-CM

## 2021-04-19 DIAGNOSIS — E785 Hyperlipidemia, unspecified: Secondary | ICD-10-CM

## 2021-04-19 DIAGNOSIS — I10 Essential (primary) hypertension: Secondary | ICD-10-CM

## 2021-04-19 NOTE — Patient Instructions (Addendum)
Aleera,   It was great talking with you today. Continue Mounjaro 5 mg weekly.   Follow up with Dr. Birdie Sons next month as scheduled.   Take care!  [name] Feliz Beam, PharmD   Visit Information   Goals Addressed               This Visit's Progress     Patient Stated     Medication Monitoring (pt-stated)        Patient Goals/Self-Care Activities Over the next 90 days, patient will:  - take medications as prescribed target a minimum of 150 minutes of moderate intensity exercise weekly engage in dietary modifications by moderation of portion sizes        Patient verbalizes understanding of instructions provided today and agrees to view in MyChart.   Plan: Telephone follow up appointment with care management team member scheduled for:  12 weeks  [name] Feliz Beam, PharmD, [place], CPP Clinical Pharmacist [facility] at [employer] [phone]

## 2021-04-19 NOTE — Chronic Care Management (AMB) (Signed)
Care Management   Pharmacy Note  04/19/2021 Name: Rebecca Hobbs MRN: [id number] DOB: Sep 10, 1965  Subjective: Rebecca Hobbs is a 55 y.o. year old female who is a primary care patient of Birdie Sons, Yehuda Mao, MD. The Care Management team was consulted for assistance with care management and care coordination needs.    Engaged with patient by telephone for follow up visit in response to provider referral for pharmacy case management and/or care coordination services.   The patient was given information about Care Management services today including:  Care Management services includes personalized support from designated clinical staff supervised by the patient's primary care provider, including individualized plan of care and coordination with other care providers. 24/7 contact phone numbers for assistance for urgent and routine care needs. The patient may stop case management services at any time by phone call to the office staff.  Patient agreed to services and consent obtained.  Assessment:  Review of patient status, including review of consultants reports, laboratory and other test data, was performed as part of comprehensive evaluation and provision of chronic care management services.   SDOH (Social Determinants of Health) assessments and interventions performed:  SDOH Interventions    Flowsheet Row Most Recent Value  SDOH Interventions   Financial Strain Interventions Intervention Not Indicated        Objective:  Lab Results  Component Value Date   CREATININE 0.85 12/08/2020   CREATININE 0.68 11/29/2020   CREATININE 0.88 11/15/2020    Lab Results  Component Value Date   HGBA1C 9.9 (H) 11/15/2020       Component Value Date/Time   CHOL 204 (H) 11/15/2020 0904   TRIG 128 11/15/2020 0904   HDL 52 11/15/2020 0904   CHOLHDL 3.9 11/15/2020 0904   LDLCALC 129 (H) 11/15/2020 0904   LDLDIRECT 67 04/05/2021 0836   LDLDIRECT 35.0 12/27/2020 1036     BP Readings from  Last 3 Encounters:  02/07/21 118/80  12/27/20 120/90  11/15/20 125/70    Care Plan  Allergies  Allergen Reactions   Metformin And Related Diarrhea and Other (See Comments)    Diarrhea and Stomach Pain   Atorvastatin Other (See Comments)    Stomach Pain Other reaction(s): Other (See Comments) Stomach Pain   Invokana [Canagliflozin] Rash   Lisinopril Cough    Medications Reviewed Today     Reviewed by Lourena Simmonds, RPH-CPP (Pharmacist) on 04/19/21 at 1130  Med List Status: <None>   Medication Order Taking? Sig Documenting Provider Last Dose Status Informant  amLODipine (NORVASC) 10 MG tablet [id number] Yes Take 1 tablet (10 mg total) by mouth daily. Glori Luis, MD Taking Active   escitalopram (LEXAPRO) 10 MG tablet [id number] Yes TAKE 1 TABLET BY MOUTH  DAILY Glori Luis, MD Taking Active   gabapentin (NEURONTIN) 100 MG capsule [id number] Yes Take 1 tablet (100 mg) by mouth in the morning and in the middle of the day, then take 3 tablets (300 mg) by mouth at bedtime Glori Luis, MD Taking Active   glucose blood [facility] VERIO) test strip [id number]  Use as instructed for CBG testing twice daily. E11.9. Quantity Sufficient for 90 day supply Glori Luis, MD  Active   Multiple Vitamin (MULTIVITAMIN) tablet [id number] Yes Take 1 tablet by mouth daily. [provider] Taking Active   pravastatin (PRAVACHOL) 20 MG tablet [id number] Yes Take 1 tablet (20 mg total) by mouth daily. Glori Luis, MD Taking Active   saccharomyces boulardii [facility]) 250 MG capsule [id number]  Yes Take 1 capsule (250 mg total) by mouth 2 (two) times daily. Theadore Nan, NP Taking Active   tirzepatide [facility]) 5 MG/0.5ML Pen [id number] Yes Inject 5 mg into the skin once a week. Glori Luis, MD Taking Active   valsartan (DIOVAN) 160 MG tablet [id number] Yes Take 1 tablet (160 mg total) by mouth daily. Glori Luis, MD Taking Active   Med List Note  Warden Fillers, [place] 09/06/16 1027): **[employer] EMPLOYEE**            Patient Active Problem List   Diagnosis Date Noted   LFTs abnormal 02/07/2021   Right knee pain 02/07/2021   Anxiety 12/27/2020   Hyperlipidemia 11/15/2020   Hot flashes 04/15/2017   Cutaneous cyst 02/04/2017   Epigastric pain 09/06/2016   Encounter for general adult medical examination with abnormal findings 08/07/2015   Neuritis of right ulnar nerve 08/07/2015   Obesity (BMI 30-39.9) 08/06/2014   DM type 2 (diabetes mellitus, type 2) (HCC) 09/08/2013   Microcalcifications of the breast 08/20/2013   HTN (hypertension) 07/08/2013    Conditions to be addressed/monitored: HLD and DMII  Care Plan : Medication Management  Updates made by Lourena Simmonds, RPH-CPP since 04/19/2021 12:00 AM     Problem: Diabetes, HLD, HTN      Long-Range Goal: Disease Progression Prevention   Start Date: 03/01/2021  This Visit's Progress: On track  Recent Progress: On track  Priority: High  Note:   Current Barriers:  Unable to achieve control of diabetes   Pharmacist Clinical Goal(s):  Over the next 90 days, patient will achieve control of diabetes as evidenced by A1c  through collaboration with PharmD and provider.   Interventions: 1:1 collaboration with Glori Luis, MD regarding development and update of comprehensive plan of care as evidenced by provider attestation and co-signature Inter-disciplinary care team collaboration (see longitudinal plan of care) Comprehensive medication review performed; medication list updated in electronic medical record  Diabetes: Uncontrolled but improving; current treatment: Mounjaro 5 mg weekly Hx metformin - IR and ER GI intolerability  Hx Invokana - rash, likely consider avoiding SGLT2 moving forward Hx Januvia  Hx glimepiride Current glucose readings: fasting and post prandial ~ 140s per patient report Denies GI upset including nausea, diarrhea,  constipation Discussed goal A1c and goal fasting <130. Could consider dose increase of Mounjaro. Patient picked up a 90 day supply with last fill, so has ~ 2 months of 5 mg dose remaining, she prefers to continue this dose at this time  Continue Mounjaro 5 mg weekly. Follow up with PCP in ~ 4 weeks as scheduled  Hypertension: Controlled per last office visit; current treatment: valsartan 160 mg daily, amlodipine 10 mg daily;  Previously recommended to continue current regimen at this time.   Hyperlipidemia: Uncontrolled; current treatment: pravastatin 20 mg daily Medications previously tried: rosuvastatin 40 mg daily - liver enzyme elevations, pravastatin 40 mg daily - unclear reason why patient discontinued; atorvastatin 40 mg - epigastric pain that resolved upon discontinuation Current dietary patterns: avoids red meats Liver enzymes normal with most recent check. Recommended to continue current regimen at this time.   Depression/Anxiety: Moderately well managed; current treatment: escitalopram 10 mg daily;  Previously recommended to continue current regimen at this time  Peripheral Neuropathy: Controlled per patient report, current regimen: gabapentin 100 mg QAM, 100 mg midday, 300 mg QPM Previously recommended to continue current regimen at this time  Patient Goals/Self-Care Activities Over the next 90 days, patient will:  -  take medications as prescribed target a minimum of 150 minutes of moderate intensity exercise weekly engage in dietary modifications by moderation of portion sizes  Follow Up Plan: Telephone follow up appointment with care management team member scheduled for: ~ 12 weeks       Medication Assistance:  None required.  Patient affirms current coverage meets needs.  Follow Up:  Patient agrees to Care Plan and Follow-up.  Plan: Telephone follow up appointment with care management team member scheduled for:  12 weeks  [name] Feliz Beam, PharmD, [place], CPP Clinical  Pharmacist [employer] at [employer] [phone]

## 2021-05-05 ENCOUNTER — Other Ambulatory Visit: Payer: Self-pay | Admitting: Family Medicine

## 2021-05-05 DIAGNOSIS — E119 Type 2 diabetes mellitus without complications: Secondary | ICD-10-CM

## 2021-05-16 ENCOUNTER — Ambulatory Visit: Payer: 59 | Admitting: Family Medicine

## 2021-05-17 ENCOUNTER — Other Ambulatory Visit: Payer: Self-pay | Admitting: Family Medicine

## 2021-05-17 DIAGNOSIS — I10 Essential (primary) hypertension: Secondary | ICD-10-CM

## 2021-06-07 ENCOUNTER — Ambulatory Visit: Payer: 59 | Admitting: Family Medicine

## 2021-06-14 ENCOUNTER — Other Ambulatory Visit: Payer: Self-pay

## 2021-06-14 ENCOUNTER — Ambulatory Visit: Payer: 59 | Admitting: Family Medicine

## 2021-06-14 ENCOUNTER — Encounter: Payer: Self-pay | Admitting: Family Medicine

## 2021-06-14 VITALS — BP 120/80 | HR 103 | Temp 98.9°F | Ht 70.0 in | Wt 247.4 lb

## 2021-06-14 DIAGNOSIS — I10 Essential (primary) hypertension: Secondary | ICD-10-CM

## 2021-06-14 DIAGNOSIS — E669 Obesity, unspecified: Secondary | ICD-10-CM

## 2021-06-14 DIAGNOSIS — E119 Type 2 diabetes mellitus without complications: Secondary | ICD-10-CM

## 2021-06-14 DIAGNOSIS — Z23 Encounter for immunization: Secondary | ICD-10-CM | POA: Diagnosis not present

## 2021-06-14 LAB — POCT GLYCOSYLATED HEMOGLOBIN (HGB A1C): Hemoglobin A1C: 6.3 % — AB (ref 4.0–5.6)

## 2021-06-14 NOTE — Patient Instructions (Addendum)
Nice to see you. Please get the updated COVID booster at the pharmacy. Please continue to work on diet and exercise. We will continue your mounjaro at the 5 mg dose at this time.

## 2021-06-14 NOTE — Assessment & Plan Note (Addendum)
Weight has been trending down.  She will continue with healthy diet and exercise.  We will continue mounjaro 5 mg once weekly.  She will monitor for abdominal pain and nausea.

## 2021-06-14 NOTE — Assessment & Plan Note (Signed)
Adequately controlled today.  I have encouraged her to start checking her blood pressure more frequently at home and if it is running greater than 130/80 consistently she is to let us know.  She will continue amlodipine 10 mg once daily and valsartan 160 mg daily.  Follow-up in 3 months.

## 2021-06-14 NOTE — Progress Notes (Signed)
Marikay Alar, MD Phone: [phone]  Rebecca Hobbs is a 55 y.o. female who presents today for f/u.  HYPERTENSION Disease Monitoring Home BP Monitoring not checking frequently, though notes it has been around 140/80-85 when she does check Chest pain- no    Dyspnea- no Medications Compliance-  taking amlodipine, valsartan.   Edema- no BMET    Component Value Date/Time   NA 140 12/08/2020 0957   K 4.7 12/08/2020 0957   CL 100 12/08/2020 0957   CO2 24 12/08/2020 0957   GLUCOSE 137 (H) 12/08/2020 0957   GLUCOSE 137 (H) 07/31/2010 1039   BUN 7 12/08/2020 0957   CREATININE 0.9 02/09/2021 0000   CREATININE 0.85 12/08/2020 0957   CALCIUM 9.7 12/08/2020 0957   GFRNONAA 64 04/15/2017 0830   GFRAA 74 04/15/2017 0830   DIABETES Disease Monitoring: Blood Sugar ranges-<120, not checking frequently Polyuria/phagia/dipsia- no      Optho- UTD Medications: Compliance- taking mounjaro Hypoglycemic symptoms- no  Obesity: Patient's weight has trended down.  She is walking 3 times a week.  She is eating mostly lean meats and vegetables.  The majority seems to help curb her appetite some.  She notes no nausea.  She did have a little bit of epigastric discomfort initially after starting it though that has resolved and she has had no further abdominal pain.   Social History   Tobacco Use  Smoking Status Never  Smokeless Tobacco Never    Current Outpatient Medications on File Prior to Visit  Medication Sig Dispense Refill   amLODipine (NORVASC) 10 MG tablet TAKE 1 TABLET BY MOUTH  DAILY 90 tablet 3   escitalopram (LEXAPRO) 10 MG tablet TAKE 1 TABLET BY MOUTH  DAILY 30 tablet 11   gabapentin (NEURONTIN) 100 MG capsule Take 1 tablet (100 mg) by mouth in the morning and in the middle of the day, then take 3 tablets (300 mg) by mouth at bedtime 450 capsule 1   glucose blood (ONETOUCH VERIO) test strip Use as instructed for CBG testing twice daily. E11.9. Quantity Sufficient for 90 day supply  200 each 3   MOUNJARO 5 MG/0.5ML Pen INJECT THE CONTENTS OF ONE  PEN SUBCUTANEOUSLY WEEKLY  AS DIRECTED 6 mL 3   Multiple Vitamin (MULTIVITAMIN) tablet Take 1 tablet by mouth daily.     pravastatin (PRAVACHOL) 20 MG tablet Take 1 tablet (20 mg total) by mouth daily. 90 tablet 3   saccharomyces boulardii (FLORASTOR) 250 MG capsule Take 1 capsule (250 mg total) by mouth 2 (two) times daily. 14 capsule 0   valsartan (DIOVAN) 160 MG tablet Take 1 tablet (160 mg total) by mouth daily. 90 tablet 3   No current facility-administered medications on file prior to visit.     ROS see history of present illness  Objective  Physical Exam Vitals:   06/14/21 0939  BP: 120/80  Pulse: (!) 103  Temp: 98.9 F (37.2 C)  SpO2: 97%    BP Readings from Last 3 Encounters:  06/14/21 120/80  02/07/21 118/80  12/27/20 120/90   Wt Readings from Last 3 Encounters:  06/14/21 247 lb 6.4 oz (112.2 kg)  02/07/21 262 lb (118.8 kg)  12/27/20 263 lb 12.8 oz (119.7 kg)    Physical Exam Constitutional:      General: She is not in acute distress.    Appearance: She is not diaphoretic.  Cardiovascular:     Rate and Rhythm: Normal rate and regular rhythm.     Heart sounds: Normal heart sounds.  Pulmonary:     Effort: Pulmonary effort is normal.     Breath sounds: Normal breath sounds.  Skin:    General: Skin is warm and dry.  Neurological:     Mental Status: She is alert.     Assessment/Plan: Please see individual problem list.  Problem List Items Addressed This Visit     DM type 2 (diabetes mellitus, type 2) (HCC)    Seems to be much improved based on home glucose readings.  A1c is well controlled at 6.3.  She will continue mounjaro 5 mg once weekly.      Relevant Orders   POCT HgB A1C (Completed)   HTN (hypertension)    Adequately controlled today.  I have encouraged her to start checking her blood pressure more frequently at home and if it is running greater than 130/80 consistently she is  to let us know.  She will continue amlodipine 10 mg once daily and valsartan 160 mg daily.  Follow-up in 3 months.      Obesity (BMI 30-39.9)    Weight has been trending down.  She will continue with healthy diet and exercise.  We will continue mounjaro 5 mg once weekly.  She will monitor for abdominal pain and nausea.      Other Visit Diagnoses     Need for immunization against influenza    -  Primary   Relevant Orders   Flu Vaccine QUAD 58mo+IM (Fluarix, Fluzone & Alfiuria Quad PF) (Completed)        Health Maintenance: Patient was given her flu vaccine and Prevnar 20 today.  She was encouraged to get the updated COVID booster at the pharmacy.  Return in about 3 months (around 09/12/2021) for Diabetes.  This visit occurred during the SARS-CoV-2 public health emergency.  Safety protocols were in place, including screening questions prior to the visit, additional usage of staff PPE, and extensive cleaning of exam room while observing appropriate contact time as indicated for disinfecting solutions.    Marikay Alar, MD [facility]

## 2021-06-14 NOTE — Assessment & Plan Note (Addendum)
Seems to be much improved based on home glucose readings.  A1c is well controlled at 6.3.  She will continue mounjaro 5 mg once weekly.

## 2021-06-15 ENCOUNTER — Encounter: Payer: Self-pay | Admitting: Family Medicine

## 2021-06-15 DIAGNOSIS — E119 Type 2 diabetes mellitus without complications: Secondary | ICD-10-CM

## 2021-06-15 MED ORDER — MOUNJARO 5 MG/0.5ML ~~LOC~~ SOAJ: SUBCUTANEOUS | 3 refills | Status: DC

## 2021-06-27 ENCOUNTER — Ambulatory Visit: Payer: 59 | Admitting: Pharmacist

## 2021-06-27 DIAGNOSIS — E785 Hyperlipidemia, unspecified: Secondary | ICD-10-CM

## 2021-06-27 DIAGNOSIS — I10 Essential (primary) hypertension: Secondary | ICD-10-CM

## 2021-06-27 DIAGNOSIS — E119 Type 2 diabetes mellitus without complications: Secondary | ICD-10-CM

## 2021-06-27 NOTE — Patient Instructions (Signed)
Visit Information  Following are the goals we discussed today:  Patient Goals/Self-Care Activities Over the next 90 days, patient will:  - take medications as prescribed target a minimum of 150 minutes of moderate intensity exercise weekly engage in dietary modifications by moderation of portion sizes        Plan: Goals of care met. Closing CCM case   [name] Darnelle Maffucci, PharmD, [place], CPP Clinical Pharmacist [place] at [facility] [phone]   Please call the care guide team at [phone] if you need to cancel or reschedule your appointment.   Patient verbalizes understanding of instructions provided today and agrees to view in [place].

## 2021-06-27 NOTE — Chronic Care Management (AMB) (Signed)
Chronic Care Management CCM Pharmacy Note  06/27/2021 Name:  Rebecca Hobbs MRN:  [id number] DOB:  [birth year]  Summary: - A1c, BP, cholesterol controlled. Tolerating regimen.   Recommendations/Changes made from today's visit: - Continue current regimen at this time  Subjective: Rebecca Hobbs is an 54 y.o. year old female who is a primary patient of Caryl Bis, Angela Adam, MD.  The CCM team was consulted for assistance with disease management and care coordination needs.    Engaged with patient by telephone for follow up visit for pharmacy case management and/or care coordination services.   Objective:  Medications Reviewed Today     Reviewed by De Hollingshead, RPH-CPP (Pharmacist) on 06/27/21 at 68  Med List Status: <None>   Medication Order Taking? Sig Documenting Provider Last Dose Status Informant  amLODipine (NORVASC) 10 MG tablet [id number] Yes TAKE 1 TABLET BY MOUTH  DAILY Leone Haven, MD Taking Active   escitalopram (LEXAPRO) 10 MG tablet [id number] Yes TAKE 1 TABLET BY MOUTH  DAILY Leone Haven, MD Taking Active            Med Note De Hollingshead   Wed Jun 27, 2021 11:03 AM) Taking PRN  gabapentin (NEURONTIN) 100 MG capsule [id number] Yes Take 1 tablet (100 mg) by mouth in the morning and in the middle of the day, then take 3 tablets (300 mg) by mouth at bedtime Leone Haven, MD Taking Active   glucose blood (ONETOUCH VERIO) test strip [id number]  Use as instructed for CBG testing twice daily. E11.9. Quantity Sufficient for 90 day supply Leone Haven, MD  Active   Multiple Vitamin (MULTIVITAMIN) tablet [id number] Yes Take 1 tablet by mouth daily. [provider] Taking Active   pravastatin (PRAVACHOL) 20 MG tablet [id number] Yes Take 1 tablet (20 mg total) by mouth daily. Leone Haven, MD Taking Active   saccharomyces boulardii [facility]) 250 MG capsule [id number] Yes Take 1 capsule (250 mg total) by mouth 2 (two) times daily. Marval Regal, NP Taking Active   tirzepatide [facility]) 5 MG/0.5ML Pen [id number] Yes INJECT THE CONTENTS OF ONE  PEN SUBCUTANEOUSLY WEEKLY  AS DIRECTED Leone Haven, MD Taking Active   valsartan (DIOVAN) 160 MG tablet [id number] Yes Take 1 tablet (160 mg total) by mouth daily. Leone Haven, MD Taking Active   Med List Note Leeanne Rio, [place] 09/06/16 1027): **[employer] EMPLOYEE**            Pertinent Labs:   Lab Results  Component Value Date   HGBA1C 6.3 (A) 06/14/2021   Lab Results  Component Value Date   CHOL 208 (A) 02/09/2021   HDL 55 02/09/2021   LDLCALC 118 02/09/2021   LDLDIRECT 67 04/05/2021   TRIG 201 (A) 02/09/2021   CHOLHDL 3.9 11/15/2020   Lab Results  Component Value Date   CREATININE 0.9 02/09/2021   BUN 7 12/08/2020   NA 140 12/08/2020   K 4.7 12/08/2020   CL 100 12/08/2020   CO2 24 12/08/2020    SDOH:  (Social Determinants of Health) assessments and interventions performed:  SDOH Interventions    Flowsheet Row Most Recent Value  SDOH Interventions   Financial Strain Interventions Intervention Not Indicated       CCM Care Plan  Review of patient past medical history, allergies, medications, health status, including review of consultants reports, laboratory and other test data, was performed as part of comprehensive evaluation and provision of chronic care management services.  Care Plan : Medication Management  Updates made by De Hollingshead, RPH-CPP since 06/27/2021 12:00 AM  Completed 06/27/2021   Problem: Diabetes, HLD, HTN Resolved 06/27/2021     Long-Range Goal: Disease Progression Prevention Completed 06/27/2021  Start Date: 03/01/2021  Recent Progress: On track  Priority: High  Note:   Current Barriers:  Unable to achieve control of diabetes   Pharmacist Clinical Goal(s):  Over the next 90 days, patient will achieve control of diabetes as evidenced by A1c  through collaboration with PharmD and provider.    Interventions: 1:1 collaboration with Leone Haven, MD regarding development and update of comprehensive plan of care as evidenced by provider attestation and co-signature Inter-disciplinary care team collaboration (see longitudinal plan of care) Comprehensive medication review performed; medication list updated in electronic medical record  Diabetes: Controlled; current treatment: Mounjaro 5 mg weekly Denies GI upset. Reports feeling more energetic on this regimen Hx metformin - IR and ER GI intolerability  Hx Invokana - rash, likely consider avoiding SGLT2 moving forward Hx Januvia  Hx glimepiride Current glucose readings: generally <120 Recommended to continue current regimen at this time. Reviewed goal A1c, goal fasting, goal 2 hour post prandial glucose. Encouraged continued periodic checks to ensure control.  Encouraged to let us know if access issues with [facility]  Hypertension: Controlled per last office visit; current treatment: valsartan 160 mg daily, amlodipine 10 mg daily;  Previously recommended to continue current regimen at this time.   Hyperlipidemia: Controlled per last lab work; current treatment: pravastatin 20 mg daily Medications previously tried: rosuvastatin 40 mg daily - liver enzyme elevations, pravastatin 40 mg daily - unclear reason why patient discontinued; atorvastatin 40 mg - epigastric pain that resolved upon discontinuation Current dietary patterns: avoids red meats Recommended to continue current regimen at this time  Depression/Anxiety: Moderately well managed; current treatment: escitalopram 10 mg daily- though taking PRN Recommended to continue current regimen at this time. Previously discussed that daily administration is generally more effective.   Peripheral Neuropathy: Controlled per patient report, current regimen: gabapentin 100 mg QAM, 100 mg midday, 300 mg QPM Previously recommended to continue current regimen at this  time  Patient Goals/Self-Care Activities Over the next 90 days, patient will:  - take medications as prescribed target a minimum of 150 minutes of moderate intensity exercise weekly engage in dietary modifications by moderation of portion sizes      Plan: Goals of care met. Closing CCM case  [name] Darnelle Maffucci, PharmD, [place], [place] Clinical Pharmacist [employer] at [employer] [phone]

## 2021-07-26 ENCOUNTER — Encounter: Payer: Self-pay | Admitting: Family Medicine

## 2021-07-26 ENCOUNTER — Other Ambulatory Visit: Payer: Self-pay

## 2021-07-26 DIAGNOSIS — E119 Type 2 diabetes mellitus without complications: Secondary | ICD-10-CM

## 2021-07-26 MED ORDER — MOUNJARO 5 MG/0.5ML ~~LOC~~ SOAJ: SUBCUTANEOUS | 3 refills | Status: DC

## 2021-08-15 ENCOUNTER — Other Ambulatory Visit: Payer: Self-pay | Admitting: Family Medicine

## 2021-08-15 DIAGNOSIS — R232 Flushing: Secondary | ICD-10-CM

## 2021-09-12 ENCOUNTER — Encounter: Payer: Self-pay | Admitting: Family Medicine

## 2021-09-12 ENCOUNTER — Other Ambulatory Visit: Payer: Self-pay

## 2021-09-12 ENCOUNTER — Ambulatory Visit: Payer: 59 | Admitting: Family Medicine

## 2021-09-12 DIAGNOSIS — E119 Type 2 diabetes mellitus without complications: Secondary | ICD-10-CM

## 2021-09-12 DIAGNOSIS — I10 Essential (primary) hypertension: Secondary | ICD-10-CM

## 2021-09-12 DIAGNOSIS — F419 Anxiety disorder, unspecified: Secondary | ICD-10-CM

## 2021-09-12 DIAGNOSIS — E669 Obesity, unspecified: Secondary | ICD-10-CM

## 2021-09-12 LAB — BASIC METABOLIC PANEL
BUN: 14 mg/dL (ref 6–23)
CO2: 27 mEq/L (ref 19–32)
Calcium: 9.9 mg/dL (ref 8.4–10.5)
Chloride: 103 mEq/L (ref 96–112)
Creatinine, Ser: 0.86 mg/dL (ref 0.40–1.20)
GFR: 76.47 mL/min (ref >60.00)
Glucose, Bld: 104 mg/dL — ABNORMAL HIGH (ref 70–99)
Potassium: 4 mEq/L (ref 3.5–5.1)
Sodium: 138 mEq/L (ref 135–145)

## 2021-09-12 LAB — HEMOGLOBIN A1C: Hgb A1c MFr Bld: 6.6 % — ABNORMAL HIGH (ref 4.6–6.5)

## 2021-09-12 MED ORDER — VALSARTAN 160 MG PO TABS: 160.0000 mg | ORAL_TABLET | Freq: Every day | ORAL | 3 refills | Status: DC

## 2021-09-12 NOTE — Assessment & Plan Note (Signed)
>>  ASSESSMENT AND PLAN FOR ANXIETY AND DEPRESSION WRITTEN ON 09/12/2021  9:45 AM BY MARIBETH, ERIC G, MD  Suboptimally managed.  Discussed taking the Lexapro  on a daily basis to get this under better control.  Discussed monitoring for weight gain with taking this daily.

## 2021-09-12 NOTE — Assessment & Plan Note (Signed)
Suboptimally managed.  Discussed taking the Lexapro on a daily basis to get this under better control.  Discussed monitoring for weight gain with taking this daily. ?

## 2021-09-12 NOTE — Patient Instructions (Signed)
Nice to see you. ?We will check lab work today and contact you with the results. ?Please add in some resistance training as we discussed. ?

## 2021-09-12 NOTE — Progress Notes (Signed)
?Tommi Rumps, MD ?Phone: [phone] ? ?Rebecca Hobbs is a 56 y.o. female who presents today for f/u. ? ?HYPERTENSION ?Disease Monitoring ?Home BP Monitoring not checking Chest pain- no    Dyspnea- no ?Medications ?Compliance-  taking amlodipine, valsartan.   Edema- no ?BMET ?   ?Component Value Date/Time  ? NA 140 12/08/2020 0957  ? K 4.7 12/08/2020 0957  ? CL 100 12/08/2020 0957  ? CO2 24 12/08/2020 0957  ? GLUCOSE 137 (H) 12/08/2020 0957  ? GLUCOSE 137 (H) 07/31/2010 1039  ? BUN 7 12/08/2020 0957  ? CREATININE 0.9 02/09/2021 0000  ? CREATININE 0.85 12/08/2020 0957  ? CALCIUM 9.7 12/08/2020 0957  ? GFRNONAA 64 04/15/2017 0830  ? GFRAA 74 04/15/2017 0830  ? ?DIABETES ?Disease Monitoring: ?Blood Sugar ranges-not checking Polyuria/phagia/dipsia- no      Optho- UTD ?Medications: ?Compliance- taking mounjaro Hypoglycemic symptoms- no ?Walking 5 days a week for exercise.  She is eating mostly vegetables and lean meats.  She notes the [facility] limits her appetite.  She does have some carbohydrates.  She does drink Monster energy drinks that are 0 sugar. ? ?Anxiety: Patient notes some anxiety.  Some depression though notes that only lasts a couple of days and she comes out of that.  She takes Lexapro as needed and she is unsure if that is helpful.  She notes no SI. ? ? ?Social History  ? ?Tobacco Use  ?Smoking Status Never  ?Smokeless Tobacco Never  ? ? ?Current Outpatient Medications on File Prior to Visit  ?Medication Sig Dispense Refill  ? amLODipine (NORVASC) 10 MG tablet TAKE 1 TABLET BY MOUTH  DAILY 90 tablet 3  ? escitalopram (LEXAPRO) 10 MG tablet TAKE 1 TABLET BY MOUTH  DAILY 30 tablet 11  ? gabapentin (NEURONTIN) 100 MG capsule TAKE 1 CAPSULE BY MOUTH IN  THE MORNING AND IN THE  MIDDLE OF THE DAY, THEN  TAKE 3 CAPSULES BY MOUTH AT BEDTIME 450 capsule 3  ? glucose blood (ONETOUCH VERIO) test strip Use as instructed for CBG testing twice daily. E11.9. Quantity Sufficient for 90 day supply 200 each 3  ?  Multiple Vitamin (MULTIVITAMIN) tablet Take 1 tablet by mouth daily.    ? pravastatin (PRAVACHOL) 20 MG tablet Take 1 tablet (20 mg total) by mouth daily. 90 tablet 3  ? saccharomyces boulardii (FLORASTOR) 250 MG capsule Take 1 capsule (250 mg total) by mouth 2 (two) times daily. 14 capsule 0  ? tirzepatide (MOUNJARO) 5 MG/0.5ML Pen INJECT THE CONTENTS OF ONE  PEN SUBCUTANEOUSLY WEEKLY  AS DIRECTED 6 mL 3  ? ?No current facility-administered medications on file prior to visit.  ? ? ? ?ROS see history of present illness ? ?Objective ? ?Physical Exam ?Vitals:  ? 09/12/21 0933  ?BP: 110/80  ?Pulse: (!) 103  ?Temp: (!) 97.5 ?F (36.4 ?C)  ?SpO2: 99%  ? ? ?BP Readings from Last 3 Encounters:  ?09/12/21 110/80  ?06/14/21 120/80  ?02/07/21 118/80  ? ?Wt Readings from Last 3 Encounters:  ?09/12/21 244 lb 6.4 oz (110.9 kg)  ?06/14/21 247 lb 6.4 oz (112.2 kg)  ?02/07/21 262 lb (118.8 kg)  ? ? ?Physical Exam ?Constitutional:   ?   General: She is not in acute distress. ?   Appearance: She is not diaphoretic.  ?Cardiovascular:  ?   Rate and Rhythm: Normal rate and regular rhythm.  ?   Heart sounds: Normal heart sounds.  ?Pulmonary:  ?   Effort: Pulmonary effort is normal.  ?  Breath sounds: Normal breath sounds.  ?Musculoskeletal:  ?   Right lower leg: No edema.  ?   Left lower leg: No edema.  ?Skin: ?   General: Skin is warm and dry.  ?Neurological:  ?   Mental Status: She is alert.  ? ? ? ?Assessment/Plan: Please see individual problem list. ? ?Problem List Items Addressed This Visit   ? ? Anxiety  ?  Suboptimally managed.  Discussed taking the Lexapro on a daily basis to get this under better control.  Discussed monitoring for weight gain with taking this daily. ?  ?  ? DM type 2 (diabetes mellitus, type 2) ([place])  ?  Check A1c.  She will continue Mounjaro 5 mg once daily.  Discussed the potential for increasing the dose depending on her A1c. ?  ?  ? Relevant Medications  ? valsartan (DIOVAN) 160 MG tablet  ? Other Relevant  Orders  ? HgB A1c  ? HTN (hypertension)  ?  Well-controlled.  She will continue amlodipine 10 mg once daily and valsartan 160 mg daily.  Check lab work. ?  ?  ? Relevant Medications  ? valsartan (DIOVAN) 160 MG tablet  ? Other Relevant Orders  ? Basic Metabolic Panel (BMET)  ? Obesity (BMI 30-39.9)  ?  She will continue to work on diet and exercise.  Discussed adding in some resistance training.  Discussed the potential for increasing the Mounjaro to see if that would help with weight loss.  Advised that if we did this she would need to monitor for low sugars. ?  ?  ? ? ?Return in about 3 months (around 12/13/2021) for Weight follow-up. ? ?This visit occurred during the SARS-CoV-2 public health emergency.  Safety protocols were in place, including screening questions prior to the visit, additional usage of staff PPE, and extensive cleaning of exam room while observing appropriate contact time as indicated for disinfecting solutions.  ? ? ?Tommi Rumps, MD ?[place] ? ?

## 2021-09-12 NOTE — Assessment & Plan Note (Signed)
She will continue to work on diet and exercise.  Discussed adding in some resistance training.  Discussed the potential for increasing the Mounjaro to see if that would help with weight loss.  Advised that if we did this she would need to monitor for low sugars. ?

## 2021-09-12 NOTE — Assessment & Plan Note (Signed)
Check A1c.  She will continue Mounjaro 5 mg once daily.  Discussed the potential for increasing the dose depending on her A1c. ?

## 2021-09-12 NOTE — Assessment & Plan Note (Signed)
Well-controlled.  She will continue amlodipine 10 mg once daily and valsartan 160 mg daily.  Check lab work. ?

## 2021-09-13 ENCOUNTER — Other Ambulatory Visit: Payer: Self-pay

## 2021-09-13 ENCOUNTER — Other Ambulatory Visit: Payer: Self-pay | Admitting: *Deleted

## 2021-09-13 MED ORDER — TIRZEPATIDE 10 MG/0.5ML ~~LOC~~ SOAJ: 10.0000 mg | SUBCUTANEOUS | 3 refills | Status: DC

## 2021-12-17 ENCOUNTER — Other Ambulatory Visit: Payer: Self-pay | Admitting: Family Medicine

## 2021-12-17 DIAGNOSIS — E785 Hyperlipidemia, unspecified: Secondary | ICD-10-CM

## 2021-12-18 ENCOUNTER — Ambulatory Visit: Payer: 59 | Admitting: Family Medicine

## 2021-12-18 ENCOUNTER — Other Ambulatory Visit: Payer: Self-pay | Admitting: Family Medicine

## 2021-12-18 ENCOUNTER — Encounter: Payer: Self-pay | Admitting: Family Medicine

## 2021-12-18 VITALS — BP 120/70 | HR 95 | Temp 98.5°F | Ht 70.0 in | Wt 249.8 lb

## 2021-12-18 DIAGNOSIS — I10 Essential (primary) hypertension: Secondary | ICD-10-CM

## 2021-12-18 DIAGNOSIS — E119 Type 2 diabetes mellitus without complications: Secondary | ICD-10-CM | POA: Diagnosis not present

## 2021-12-18 DIAGNOSIS — Z23 Encounter for immunization: Secondary | ICD-10-CM

## 2021-12-18 LAB — POCT GLYCOSYLATED HEMOGLOBIN (HGB A1C): Hemoglobin A1C: 6.2 % — AB (ref 4.0–5.6)

## 2021-12-18 MED ORDER — TIRZEPATIDE 7.5 MG/0.5ML ~~LOC~~ SOAJ: 7.5000 mg | SUBCUTANEOUS | 2 refills | Status: DC

## 2021-12-18 NOTE — Patient Instructions (Signed)
Nice to see you. Please bring your blood pressure cuff to your nurse visit in several weeks so we can see if it is accurate.

## 2021-12-18 NOTE — Telephone Encounter (Signed)
So mass confusion. PA was approved through September & just would not go through for 90 day supply. Pharmacist at [facility] [address] stated that she thought that patient's insurance preferred mail order or [facility]. She may not be able to get the 30 day script there for the 7.5 mg again. I called patient and made her aware of this. If she has issues in the future she will have [place] send to [facility].

## 2021-12-18 NOTE — Assessment & Plan Note (Signed)
Well-controlled in the office.  We will have her return for nurse visit to compare her blood pressure cuff to our readings to see if her cuff is accurate.  She will continue amlodipine 10 mg once daily and valsartan 160 mg daily.

## 2021-12-18 NOTE — Progress Notes (Signed)
Marikay Alar, MD Phone: [phone]  Rebecca Hobbs is a 56 y.o. female who presents today for f/u.  HYPERTENSION Disease Monitoring Home BP Monitoring 140s systolic Chest pain- no    Dyspnea- no Medications Compliance-  taking amlodipine, valsartan.   Edema- no BMET    Component Value Date/Time   NA 138 09/12/2021 0947   NA 140 12/08/2020 0957   K 4.0 09/12/2021 0947   CL 103 09/12/2021 0947   CO2 27 09/12/2021 0947   GLUCOSE 104 (H) 09/12/2021 0947   BUN 14 09/12/2021 0947   BUN 7 12/08/2020 0957   CREATININE 0.86 09/12/2021 0947   CALCIUM 9.9 09/12/2021 0947   GFRNONAA 64 04/15/2017 0830   GFRAA 74 04/15/2017 0830   DIABETES Disease Monitoring: Blood Sugar ranges-not checking Polyuria/phagia/dipsia- no      Optho- UTD Medications: Compliance- taking mounjaro 5 mg weekly Hypoglycemic symptoms- no   Social History   Tobacco Use  Smoking Status Never  Smokeless Tobacco Never    Current Outpatient Medications on File Prior to Visit  Medication Sig Dispense Refill   amLODipine (NORVASC) 10 MG tablet TAKE 1 TABLET BY MOUTH  DAILY 90 tablet 3   escitalopram (LEXAPRO) 10 MG tablet TAKE 1 TABLET BY MOUTH  DAILY 30 tablet 11   gabapentin (NEURONTIN) 100 MG capsule TAKE 1 CAPSULE BY MOUTH IN  THE MORNING AND IN THE  MIDDLE OF THE DAY, THEN  TAKE 3 CAPSULES BY MOUTH AT BEDTIME 450 capsule 3   glucose blood (ONETOUCH VERIO) test strip Use as instructed for CBG testing twice daily. E11.9. Quantity Sufficient for 90 day supply 200 each 3   Multiple Vitamin (MULTIVITAMIN) tablet Take 1 tablet by mouth daily.     pravastatin (PRAVACHOL) 20 MG tablet TAKE 1 TABLET BY MOUTH  DAILY 90 tablet 3   saccharomyces boulardii (FLORASTOR) 250 MG capsule Take 1 capsule (250 mg total) by mouth 2 (two) times daily. 14 capsule 0   valsartan (DIOVAN) 160 MG tablet Take 1 tablet (160 mg total) by mouth daily. 90 tablet 3   No current facility-administered medications on file prior to visit.      ROS see history of present illness  Objective  Physical Exam Vitals:   12/18/21 0933  BP: 120/70  Pulse: 95  Temp: 98.5 F (36.9 C)  SpO2: 95%    BP Readings from Last 3 Encounters:  12/18/21 120/70  09/12/21 110/80  06/14/21 120/80   Wt Readings from Last 3 Encounters:  12/18/21 249 lb 12.8 oz (113.3 kg)  09/12/21 244 lb 6.4 oz (110.9 kg)  06/14/21 247 lb 6.4 oz (112.2 kg)    Physical Exam Constitutional:      General: She is not in acute distress.    Appearance: She is not diaphoretic.  Cardiovascular:     Rate and Rhythm: Normal rate and regular rhythm.     Heart sounds: Normal heart sounds.  Pulmonary:     Effort: Pulmonary effort is normal.     Breath sounds: Normal breath sounds.  Musculoskeletal:     Right lower leg: No edema.     Left lower leg: No edema.  Skin:    General: Skin is warm and dry.  Neurological:     Mental Status: She is alert.      Assessment/Plan: Please see individual problem list.  Problem List Items Addressed This Visit     DM type 2 (diabetes mellitus, type 2) (HCC) (Chronic)    Check A1c.  We  will increase to [facility] to 7.5 mg weekly.  She will monitor for nausea.      Relevant Medications   tirzepatide (MOUNJARO) 7.5 MG/0.5ML Pen   Other Relevant Orders   POCT HgB A1C (Completed)   HTN (hypertension) (Chronic)    Well-controlled in the office.  We will have her return for nurse visit to compare her blood pressure cuff to our readings to see if her cuff is accurate.  She will continue amlodipine 10 mg once daily and valsartan 160 mg daily.      Other Visit Diagnoses     Need for shingles vaccine    -  Primary   Relevant Orders   Varicella-zoster vaccine IM (Shingrix)   Need for Td vaccine       Relevant Orders   Td : Tetanus/diphtheria >7yo Preservative  free (Completed)        Health Maintenance: Patient was given the Shingrix vaccine and Td vaccine today.  I did discuss that her insurance may not  cover the Shingrix vaccine and it could cost her out-of-pocket.  She opted to proceed with this without checking with her insurance.  Return in about 3 weeks (around 01/08/2022) for Nurse blood pressure check to compare patient cuff, 3 months with pcp.   Marikay Alar, MD [facility]

## 2021-12-18 NOTE — Telephone Encounter (Signed)
Can we do a PA? She has been on the 5 mg dose so I'm not sure why she would need the PA.

## 2021-12-18 NOTE — Telephone Encounter (Signed)
PA has been sent & awaiting outcome.

## 2021-12-18 NOTE — Assessment & Plan Note (Signed)
Check A1c.  We will increase to [facility] to 7.5 mg weekly.  She will monitor for nausea.

## 2021-12-19 ENCOUNTER — Telehealth: Payer: Self-pay

## 2021-12-19 NOTE — Telephone Encounter (Signed)
I called and LVM on the pharmacy line informing them that the patient was approved for her Mounjaro  from 03/06/2021-03/06/2022 this informations was left on VM.  Laquilla Dault,cma

## 2022-01-08 ENCOUNTER — Ambulatory Visit: Payer: 59

## 2022-01-08 VITALS — BP 116/70 | HR 108

## 2022-01-08 DIAGNOSIS — I10 Essential (primary) hypertension: Secondary | ICD-10-CM

## 2022-01-08 NOTE — Progress Notes (Signed)
Pt presents today for a BP machine check. Manual reading in the left arm was 120/72 pulse 109, right arm 116/70 pulse 108. Pt's bp machine left arm reading was 138/99 pulse 111, right arm was 124/93 pulse 111. Pt's cuff did not seem big enough for her arm. Pt was advised that she would need a bigger cuff to get a more accurate reading. Pt gave a verbal understanding.

## 2022-03-19 ENCOUNTER — Other Ambulatory Visit: Payer: Self-pay | Admitting: Family Medicine

## 2022-03-19 DIAGNOSIS — E119 Type 2 diabetes mellitus without complications: Secondary | ICD-10-CM

## 2022-03-19 NOTE — Telephone Encounter (Signed)
PA done awaiting a response. Ava Tangney,cma  

## 2022-03-20 ENCOUNTER — Encounter: Payer: Self-pay | Admitting: Family Medicine

## 2022-03-20 ENCOUNTER — Ambulatory Visit: Payer: 59 | Admitting: Family Medicine

## 2022-03-20 VITALS — BP 110/70 | HR 97 | Temp 97.5°F | Ht 70.0 in | Wt 241.0 lb

## 2022-03-20 DIAGNOSIS — Z1159 Encounter for screening for other viral diseases: Secondary | ICD-10-CM

## 2022-03-20 DIAGNOSIS — R232 Flushing: Secondary | ICD-10-CM

## 2022-03-20 DIAGNOSIS — F32A Depression, unspecified: Secondary | ICD-10-CM

## 2022-03-20 DIAGNOSIS — R42 Dizziness and giddiness: Secondary | ICD-10-CM | POA: Diagnosis not present

## 2022-03-20 DIAGNOSIS — I10 Essential (primary) hypertension: Secondary | ICD-10-CM

## 2022-03-20 DIAGNOSIS — F419 Anxiety disorder, unspecified: Secondary | ICD-10-CM

## 2022-03-20 DIAGNOSIS — Z23 Encounter for immunization: Secondary | ICD-10-CM

## 2022-03-20 DIAGNOSIS — E119 Type 2 diabetes mellitus without complications: Secondary | ICD-10-CM

## 2022-03-20 MED ORDER — GABAPENTIN 300 MG PO CAPS: 300.0000 mg | ORAL_CAPSULE | Freq: Two times a day (BID) | ORAL | 1 refills | Status: DC

## 2022-03-20 MED ORDER — ESCITALOPRAM OXALATE 20 MG PO TABS: 20.0000 mg | ORAL_TABLET | Freq: Every day | ORAL | 3 refills | Status: DC

## 2022-03-20 NOTE — Assessment & Plan Note (Addendum)
The description seems to be orthostasis though orthostatics were negative today.  Discussed rising slowly from a seated position or bent over position.  Discussed drinking at least 64 ounces of water daily.  Check lab work to see if anything underlying could be contributing.

## 2022-03-20 NOTE — Progress Notes (Signed)
Tommi Rumps, MD Phone: [phone]  Rebecca Hobbs is a 56 y.o. female who presents today for f/u.  HYPERTENSION Disease Monitoring Home BP Monitoring 120s/80s, patients BP cuff is known to be inaccurate on the high side Chest pain- no    Dyspnea- no Medications Compliance-  taking valsartan, amlodipine. Lightheadedness-  see below  Edema- no BMET    Component Value Date/Time   NA 138 09/12/2021 0947   NA 140 12/08/2020 0957   K 4.0 09/12/2021 0947   CL 103 09/12/2021 0947   CO2 27 09/12/2021 0947   GLUCOSE 104 (H) 09/12/2021 0947   BUN 14 09/12/2021 0947   BUN 7 12/08/2020 0957   CREATININE 0.86 09/12/2021 0947   CALCIUM 9.9 09/12/2021 0947   GFRNONAA 64 04/15/2017 0830   GFRAA 74 04/15/2017 0830   DIABETES Disease Monitoring: Blood Sugar ranges-90s-100s Polyuria/phagia/dipsia- no      Optho- UTd Medications: Compliance- taking mounjaro     Hypoglycemic symptoms- no  Hot flashes: Patient notes hot flashes have gotten a little worse recently.  She will feel hot centrally and then it will spread from her chest.  She will have sweatiness from her upper chest up to her head.  She is postmenopausal.  She takes gabapentin 200 mg during the day and 300 mg at night.  This was previously beneficial.  Anxiety: Patient notes her anxiety has been bothering her more recently.  She has noted some mild depression as well.  She notes there are no provoking factors for these things being worse.  No SI or HI.  She takes Lexapro 10 mg daily.  Lightheadedness: Patient notes for the last 2 to 3 weeks she will have some mild lightheadedness when she stands up from being bent over.  She notes it does not occur daily.  She notes no vertigo symptoms.  No recent illnesses.  She reports drinking plenty of water.   Social History   Tobacco Use  Smoking Status Never  Smokeless Tobacco Never    Current Outpatient Medications on File Prior to Visit  Medication Sig Dispense Refill   amLODipine  (NORVASC) 10 MG tablet TAKE 1 TABLET BY MOUTH  DAILY 90 tablet 3   glucose blood (ONETOUCH VERIO) test strip Use as instructed for CBG testing twice daily. E11.9. Quantity Sufficient for 90 day supply 200 each 3   Multiple Vitamin (MULTIVITAMIN) tablet Take 1 tablet by mouth daily.     pravastatin (PRAVACHOL) 20 MG tablet TAKE 1 TABLET BY MOUTH  DAILY 90 tablet 3   saccharomyces boulardii (FLORASTOR) 250 MG capsule Take 1 capsule (250 mg total) by mouth 2 (two) times daily. 14 capsule 0   tirzepatide (MOUNJARO) 7.5 MG/0.5ML Pen Inject 7.5 mg into the skin once a week. 6 mL 2   valsartan (DIOVAN) 160 MG tablet Take 1 tablet (160 mg total) by mouth daily. 90 tablet 3   No current facility-administered medications on file prior to visit.     ROS see history of present illness  Objective  Physical Exam Vitals:   03/20/22 0908  BP: 110/70  Pulse: 97  Temp: (!) 97.5 F (36.4 C)  SpO2: 98%   Lying blood pressure 104/71 pulse 87 Sitting blood pressure 116/77 pulse 91 Standing blood pressure 121/81 pulse 99  BP Readings from Last 3 Encounters:  03/20/22 110/70  01/08/22 116/70  12/18/21 120/70   Wt Readings from Last 3 Encounters:  03/20/22 241 lb (109.3 kg)  12/18/21 249 lb 12.8 oz (113.3 kg)  09/12/21 244 lb 6.4 oz (110.9 kg)    Physical Exam Constitutional:      General: She is not in acute distress.    Appearance: She is not diaphoretic.  Cardiovascular:     Rate and Rhythm: Normal rate and regular rhythm.     Heart sounds: Normal heart sounds.  Pulmonary:     Effort: Pulmonary effort is normal.     Breath sounds: Normal breath sounds.  Lymphadenopathy:     Cervical: No cervical adenopathy.  Skin:    General: Skin is warm and dry.  Neurological:     Mental Status: She is alert.    Diabetic Foot Exam - Simple   Simple Foot Form Diabetic Foot exam was performed with the following findings: Yes 03/20/2022  9:31 AM  Visual Inspection No deformities, no ulcerations,  no other skin breakdown bilaterally: Yes Sensation Testing Intact to touch and monofilament testing bilaterally: Yes Pulse Check Posterior Tibialis and Dorsalis pulse intact bilaterally: Yes Comments      Assessment/Plan: Please see individual problem list.  Problem List Items Addressed This Visit     Anxiety and depression (Chronic)    Worsened recently.  We will increase her Lexapro to 20 mg once daily.  Follow-up in 8 weeks.      Relevant Medications   escitalopram (LEXAPRO) 20 MG tablet   DM type 2 (diabetes mellitus, type 2) (HCC) (Chronic)    Check A1c.  Continue Mounjaro 7.5 mg weekly.      Relevant Orders   HgB A1c   Urine Microalbumin w/creat. ratio   Hot flashes (Chronic)    Symptoms are consistent with hot flashes.  We will have her take gabapentin 300 mg twice daily.  If this is not beneficial over the next week she will let us know and we can increase the nighttime dose to 600 mg.  If she has excessive drowsiness with this she will let us know.  Discussed that the Lexapro dose increase may be beneficial for this as well.      Relevant Medications   gabapentin (NEURONTIN) 300 MG capsule   Other Relevant Orders   CBC   TSH   HTN (hypertension) - Primary (Chronic)    Well-controlled in the office.  Her home cuff is known to be an accurate on the high side.  She will continue amlodipine 10 mg once daily and valsartan 160 mg daily.      Relevant Orders   Comp Met (CMET)   Lightheadedness    The description seems to be orthostasis though orthostatics were negative today.  Discussed rising slowly from a seated position or bent over position.  Discussed drinking at least 64 ounces of water daily.  Check lab work to see if anything underlying could be contributing.      Relevant Orders   CBC   Comp Met (CMET)   TSH   Other Visit Diagnoses     Need for immunization against influenza       Relevant Orders   Flu Vaccine QUAD 78moIM (Fluarix, Fluzone & Alfiuria  Quad PF) (Completed)   Need for hepatitis C screening test       Relevant Orders   Hepatitis C Antibody   Need for shingles vaccine       Relevant Orders   Zoster Recombinant (Shingrix ) (Completed)        Health Maintenance: Hepatitis C screening ordered.  Shingrix vaccine given today.  Flu vaccine given today.  Return in about  2 months (around 05/20/2022) for Anxiety/depression.   Tommi Rumps, MD [place]

## 2022-03-20 NOTE — Patient Instructions (Signed)
Nice to see you. We will get lab work today and contact you with the results. Please contact me in a week if the gabapentin 300 mg twice daily is not beneficial for your hot flashes.

## 2022-03-20 NOTE — Assessment & Plan Note (Signed)
>>  ASSESSMENT AND PLAN FOR ANXIETY AND DEPRESSION WRITTEN ON 03/20/2022  9:23 AM BY MARIBETH, ERIC G, MD  Worsened recently.  We will increase her Lexapro  to 20 mg once daily.  Follow-up in 8 weeks.

## 2022-03-20 NOTE — Assessment & Plan Note (Signed)
Well-controlled in the office.  Her home cuff is known to be an accurate on the high side.  She will continue amlodipine 10 mg once daily and valsartan 160 mg daily.

## 2022-03-20 NOTE — Assessment & Plan Note (Signed)
Check A1c.  Continue Mounjaro 7.5 mg weekly.

## 2022-03-20 NOTE — Assessment & Plan Note (Signed)
Worsened recently.  We will increase her Lexapro to 20 mg once daily.  Follow-up in 8 weeks.

## 2022-03-20 NOTE — Assessment & Plan Note (Signed)
Symptoms are consistent with hot flashes.  We will have her take gabapentin 300 mg twice daily.  If this is not beneficial over the next week she will let us know and we can increase the nighttime dose to 600 mg.  If she has excessive drowsiness with this she will let us know.  Discussed that the Lexapro dose increase may be beneficial for this as well.

## 2022-03-21 LAB — CBC
Hematocrit: 41.2 % (ref 34.0–46.6)
Hemoglobin: 13.1 g/dL (ref 11.1–15.9)
MCH: 26.4 pg — ABNORMAL LOW (ref 26.6–33.0)
MCHC: 31.8 g/dL (ref 31.5–35.7)
MCV: 83 fL (ref 79–97)
Platelets: 325 10*3/uL (ref 150–450)
RBC: 4.97 x10E6/uL (ref 3.77–5.28)
RDW: 14.3 % (ref 11.7–15.4)
WBC: 7.5 10*3/uL (ref 3.4–10.8)

## 2022-03-21 LAB — COMPREHENSIVE METABOLIC PANEL
ALT: 21 IU/L (ref 0–32)
AST: 20 IU/L (ref 0–40)
Albumin/Globulin Ratio: 1.5 (ref 1.2–2.2)
Albumin: 4.6 g/dL (ref 3.8–4.9)
Alkaline Phosphatase: 84 IU/L (ref 44–121)
BUN/Creatinine Ratio: 13 (ref 9–23)
BUN: 11 mg/dL (ref 6–24)
Bilirubin Total: 0.3 mg/dL (ref 0.0–1.2)
CO2: 22 mmol/L (ref 20–29)
Calcium: 9.7 mg/dL (ref 8.7–10.2)
Chloride: 103 mmol/L (ref 96–106)
Creatinine, Ser: 0.83 mg/dL (ref 0.57–1.00)
Globulin, Total: 3 g/dL (ref 1.5–4.5)
Glucose: 113 mg/dL — ABNORMAL HIGH (ref 70–99)
Potassium: 4.3 mmol/L (ref 3.5–5.2)
Sodium: 139 mmol/L (ref 134–144)
Total Protein: 7.6 g/dL (ref 6.0–8.5)
eGFR: 83 mL/min/{1.73_m2} (ref >59)

## 2022-03-21 LAB — MICROALBUMIN / CREATININE URINE RATIO
Creatinine, Urine: 441.3 mg/dL
Microalb/Creat Ratio: 24 mg/g creat (ref 0–29)
Microalbumin, Urine: 104.1 ug/mL

## 2022-03-21 LAB — TSH: TSH: 2.86 u[IU]/mL (ref 0.450–4.500)

## 2022-03-21 LAB — HEMOGLOBIN A1C
Est. average glucose Bld gHb Est-mCnc: 126 mg/dL
Hgb A1c MFr Bld: 6 % — ABNORMAL HIGH (ref 4.8–5.6)

## 2022-03-21 LAB — HEPATITIS C ANTIBODY: Hep C Virus Ab: NONREACTIVE

## 2022-03-26 ENCOUNTER — Other Ambulatory Visit: Payer: Self-pay

## 2022-03-26 DIAGNOSIS — E119 Type 2 diabetes mellitus without complications: Secondary | ICD-10-CM

## 2022-03-26 MED ORDER — TIRZEPATIDE 7.5 MG/0.5ML ~~LOC~~ SOAJ: 7.5000 mg | SUBCUTANEOUS | 2 refills | Status: DC

## 2022-04-09 NOTE — Telephone Encounter (Signed)
I did a PA on covermymeds and they sent a message to call [phone]. I called and the medication was approved from 12/19/2021-12/20/2022.  Case # [id number]. Patient was aware and has received her medications by mail.  Jenella Craigie,cma

## 2022-04-18 ENCOUNTER — Other Ambulatory Visit: Payer: Self-pay | Admitting: Family Medicine

## 2022-04-18 DIAGNOSIS — I10 Essential (primary) hypertension: Secondary | ICD-10-CM

## 2022-05-22 ENCOUNTER — Ambulatory Visit: Payer: 59 | Admitting: Family Medicine

## 2022-07-10 ENCOUNTER — Ambulatory Visit: Payer: 59 | Admitting: Family Medicine

## 2022-07-10 ENCOUNTER — Encounter: Payer: Self-pay | Admitting: Family Medicine

## 2022-07-10 VITALS — BP 118/70 | HR 97 | Temp 97.6°F | Ht 70.0 in | Wt 237.2 lb

## 2022-07-10 DIAGNOSIS — I10 Essential (primary) hypertension: Secondary | ICD-10-CM

## 2022-07-10 DIAGNOSIS — F32A Depression, unspecified: Secondary | ICD-10-CM

## 2022-07-10 DIAGNOSIS — E119 Type 2 diabetes mellitus without complications: Secondary | ICD-10-CM | POA: Diagnosis not present

## 2022-07-10 DIAGNOSIS — F419 Anxiety disorder, unspecified: Secondary | ICD-10-CM | POA: Diagnosis not present

## 2022-07-10 LAB — POCT GLYCOSYLATED HEMOGLOBIN (HGB A1C): Hemoglobin A1C: 5.6 % (ref 4.0–5.6)

## 2022-07-10 NOTE — Assessment & Plan Note (Signed)
Chronic issue.  Check A1c.  Continue Mounjaro 7.5 mg weekly.

## 2022-07-10 NOTE — Assessment & Plan Note (Signed)
Chronic issue.  I have asked her to start checking at home.  Discussed a goal blood pressure of less than 130/80.  She will let me know if it is running higher than that.  She will continue amlodipine 10 mg daily and valsartan 160 mg daily.

## 2022-07-10 NOTE — Progress Notes (Signed)
Tommi Rumps, MD Phone: [phone]  Rebecca Hobbs is a 57 y.o. female who presents today for f/u.  HYPERTENSION Disease Monitoring Home BP Monitoring not checking Chest pain- no    Dyspnea- no Medications Compliance-  taking amlodipine, valsartan.   Edema- no BMET    Component Value Date/Time   NA 139 03/20/2022 0939   K 4.3 03/20/2022 0939   CL 103 03/20/2022 0939   CO2 22 03/20/2022 0939   GLUCOSE 113 (H) 03/20/2022 0939   GLUCOSE 104 (H) 09/12/2021 0947   BUN 11 03/20/2022 0939   CREATININE 0.83 03/20/2022 0939   CALCIUM 9.7 03/20/2022 0939   GFRNONAA 64 04/15/2017 0830   GFRAA 74 04/15/2017 0830   DIABETES Disease Monitoring: Blood Sugar ranges-not checking Polyuria/phagia/dipsia- no      Medications: Compliance- taking mounjaro  Hypoglycemic symptoms- no  Anxiety/depression: Patient notes this is significantly improved.  She notes no anxiety or depression.  She is taking Lexapro.  No side effects with the Lexapro.  No SI.   Social History   Tobacco Use  Smoking Status Never  Smokeless Tobacco Never    Current Outpatient Medications on File Prior to Visit  Medication Sig Dispense Refill   amLODipine (NORVASC) 10 MG tablet TAKE 1 TABLET BY MOUTH DAILY 90 tablet 3   escitalopram (LEXAPRO) 20 MG tablet Take 1 tablet (20 mg total) by mouth daily. 90 tablet 3   gabapentin (NEURONTIN) 300 MG capsule Take 1 capsule (300 mg total) by mouth 2 (two) times daily. 180 capsule 1   glucose blood (ONETOUCH VERIO) test strip Use as instructed for CBG testing twice daily. E11.9. Quantity Sufficient for 90 day supply 200 each 3   Multiple Vitamin (MULTIVITAMIN) tablet Take 1 tablet by mouth daily.     pravastatin (PRAVACHOL) 20 MG tablet TAKE 1 TABLET BY MOUTH  DAILY 90 tablet 3   saccharomyces boulardii (FLORASTOR) 250 MG capsule Take 1 capsule (250 mg total) by mouth 2 (two) times daily. 14 capsule 0   tirzepatide (MOUNJARO) 7.5 MG/0.5ML Pen Inject 7.5 mg into the skin  once a week. 6 mL 2   valsartan (DIOVAN) 160 MG tablet Take 1 tablet (160 mg total) by mouth daily. 90 tablet 3   No current facility-administered medications on file prior to visit.     ROS see history of present illness  Objective  Physical Exam Vitals:   07/10/22 0932 07/10/22 0944  BP: 118/80 118/70  Pulse: 97   Temp: 97.6 F (36.4 C)   SpO2: 99%     BP Readings from Last 3 Encounters:  07/10/22 118/70  03/20/22 110/70  01/08/22 116/70   Wt Readings from Last 3 Encounters:  07/10/22 237 lb 3.2 oz (107.6 kg)  03/20/22 241 lb (109.3 kg)  12/18/21 249 lb 12.8 oz (113.3 kg)    Physical Exam Constitutional:      General: She is not in acute distress.    Appearance: She is not diaphoretic.  Cardiovascular:     Rate and Rhythm: Normal rate and regular rhythm.     Heart sounds: Normal heart sounds.  Pulmonary:     Effort: Pulmonary effort is normal.     Breath sounds: Normal breath sounds.  Musculoskeletal:     Right lower leg: No edema.     Left lower leg: No edema.  Skin:    General: Skin is warm and dry.  Neurological:     Mental Status: She is alert.      Assessment/Plan: Please  see individual problem list.  Primary hypertension Assessment & Plan: Chronic issue.  I have asked her to start checking at home.  Discussed a goal blood pressure of less than 130/80.  She will let me know if it is running higher than that.  She will continue amlodipine 10 mg daily and valsartan 160 mg daily.   Anxiety and depression Assessment & Plan: Chronic issue.  Asymptomatic.  She will continue Lexapro 20 mg daily.   Type 2 diabetes mellitus without complication, without long-term current use of insulin ([place]) Assessment & Plan: Chronic issue.  Check A1c.  Continue Mounjaro 7.5 mg weekly.  Orders: -     POCT glycosylated hemoglobin (Hb A1C)    Return in about 6 months (around 01/08/2023) for Hypertension/diabetes.   Tommi Rumps, MD [facility]

## 2022-07-10 NOTE — Assessment & Plan Note (Signed)
>>  ASSESSMENT AND PLAN FOR ANXIETY AND DEPRESSION WRITTEN ON 07/10/2022  9:37 AM BY MARIBETH, ERIC G, MD  Chronic issue.  Asymptomatic.  She will continue Lexapro  20 mg daily.

## 2022-07-10 NOTE — Assessment & Plan Note (Signed)
Chronic issue.  Asymptomatic.  She will continue Lexapro 20 mg daily.

## 2022-07-10 NOTE — Patient Instructions (Signed)
Nice to see you. Please try to start monitoring your blood pressure and your sugars.

## 2022-07-29 ENCOUNTER — Other Ambulatory Visit: Payer: Self-pay | Admitting: Family Medicine

## 2022-07-29 DIAGNOSIS — R232 Flushing: Secondary | ICD-10-CM

## 2022-09-29 ENCOUNTER — Other Ambulatory Visit: Payer: Self-pay | Admitting: Family Medicine

## 2022-09-29 DIAGNOSIS — E119 Type 2 diabetes mellitus without complications: Secondary | ICD-10-CM

## 2022-10-23 ENCOUNTER — Other Ambulatory Visit: Payer: Self-pay | Admitting: Family Medicine

## 2022-10-23 DIAGNOSIS — I10 Essential (primary) hypertension: Secondary | ICD-10-CM

## 2022-11-14 ENCOUNTER — Other Ambulatory Visit: Payer: Self-pay | Admitting: Family

## 2022-11-14 DIAGNOSIS — E785 Hyperlipidemia, unspecified: Secondary | ICD-10-CM

## 2022-11-28 ENCOUNTER — Telehealth: Payer: Self-pay

## 2022-11-28 NOTE — Telephone Encounter (Signed)
PA initiated via Covermymeds; KEY: [id number]. Awaiting determination.

## 2022-11-28 NOTE — Telephone Encounter (Signed)
PA approved.

## 2022-12-02 NOTE — Telephone Encounter (Signed)
Called Patient to let her know that the PA for Rebecca Hobbs has been approved

## 2023-01-08 ENCOUNTER — Encounter: Payer: Self-pay | Admitting: Family Medicine

## 2023-01-08 ENCOUNTER — Ambulatory Visit: Payer: 59 | Admitting: Family Medicine

## 2023-01-08 VITALS — BP 128/82 | HR 98 | Temp 97.8°F | Ht 70.0 in | Wt 232.8 lb

## 2023-01-08 DIAGNOSIS — E785 Hyperlipidemia, unspecified: Secondary | ICD-10-CM | POA: Diagnosis not present

## 2023-01-08 DIAGNOSIS — R232 Flushing: Secondary | ICD-10-CM

## 2023-01-08 DIAGNOSIS — E119 Type 2 diabetes mellitus without complications: Secondary | ICD-10-CM | POA: Diagnosis not present

## 2023-01-08 DIAGNOSIS — I10 Essential (primary) hypertension: Secondary | ICD-10-CM | POA: Diagnosis not present

## 2023-01-08 LAB — POCT GLYCOSYLATED HEMOGLOBIN (HGB A1C): Hemoglobin A1C: 5.7 % — AB (ref 4.0–5.6)

## 2023-01-08 NOTE — Assessment & Plan Note (Signed)
Chronic issue.  Continue pravastatin 20 mg daily.  Check lipid panel.

## 2023-01-08 NOTE — Progress Notes (Signed)
Marikay Alar, MD Phone: [phone]  Rebecca Hobbs is a 57 y.o. female who presents today for f/u.  HYPERTENSION Disease Monitoring: Blood pressure range-not checking Chest pain- no      Dyspnea- no Medications: Compliance- taking amlodipine, valsartan   Edema- no  DIABETES Disease Monitoring: Blood Sugar ranges-not checking often Polyuria/phagia/dipsia- no      Optho- UTD Medications: Compliance- taking mounjaro Hypoglycemic symptoms- no Reports she is not exercising much due to the heat.  She is getting lots of vegetables and lean meats.  HYPERLIPIDEMIA Disease Monitoring: See symptoms for Hypertension Medications: Compliance- taking pravastatin Right upper quadrant pain- no  Muscle aches- no  Hot flashes: Patient notes this feels like heat wave coming over her body and then she developed sweating.  Happens day and night.  Notes there is a new medication out from flashes and she wonders about taking that.  Currently she is taking gabapentin.  She does not feel as though the gabapentin is beneficial.    Social History   Tobacco Use  Smoking Status Never  Smokeless Tobacco Never    Current Outpatient Medications on File Prior to Visit  Medication Sig Dispense Refill   amLODipine (NORVASC) 10 MG tablet TAKE 1 TABLET BY MOUTH DAILY 90 tablet 3   escitalopram (LEXAPRO) 20 MG tablet Take 1 tablet (20 mg total) by mouth daily. 90 tablet 3   glucose blood (ONETOUCH VERIO) test strip Use as instructed for CBG testing twice daily. E11.9. Quantity Sufficient for 90 day supply 200 each 3   MOUNJARO 7.5 MG/0.5ML Pen INJECT THE CONTENTS OF ONE PEN  SUBCUTANEOUSLY WEEKLY AS  DIRECTED 6 mL 3   Multiple Vitamin (MULTIVITAMIN) tablet Take 1 tablet by mouth daily.     pravastatin (PRAVACHOL) 20 MG tablet TAKE 1 TABLET BY MOUTH  DAILY 90 tablet 3   saccharomyces boulardii (FLORASTOR) 250 MG capsule Take 1 capsule (250 mg total) by mouth 2 (two) times daily. 14 capsule 0   valsartan  (DIOVAN) 160 MG tablet TAKE 1 TABLET BY MOUTH DAILY 90 tablet 3   No current facility-administered medications on file prior to visit.     ROS see history of present illness  Objective  Physical Exam Vitals:   01/08/23 0839 01/08/23 0901  BP: (!) 144/84 128/82  Pulse: 98   Temp: 97.8 F (36.6 C)   SpO2: 99%     BP Readings from Last 3 Encounters:  01/08/23 128/82  07/10/22 118/70  03/20/22 110/70   Wt Readings from Last 3 Encounters:  01/08/23 232 lb 12.8 oz (105.6 kg)  07/10/22 237 lb 3.2 oz (107.6 kg)  03/20/22 241 lb (109.3 kg)    Physical Exam Constitutional:      General: She is not in acute distress.    Appearance: She is not diaphoretic.  Cardiovascular:     Rate and Rhythm: Normal rate and regular rhythm.     Heart sounds: Normal heart sounds.  Pulmonary:     Effort: Pulmonary effort is normal.     Breath sounds: Normal breath sounds.  Skin:    General: Skin is warm and dry.  Neurological:     Mental Status: She is alert.      Assessment/Plan: Please see individual problem list.  Primary hypertension Assessment & Plan: Chronic issue.  Slightly above goal.  She will start checking at home.  Discussed goal blood pressure of less than 130/80.  We will have her return in 6 weeks to follow-up on this.  She  will continue amlodipine 10 mg daily and valsartan 160 mg daily.  Orders: -     Comprehensive metabolic panel  Type 2 diabetes mellitus without complication, without long-term current use of insulin ([facility]) Assessment & Plan: Chronic issue.  Very well-controlled.  She will continue Mounjaro 7.5 mg weekly.  Encouraged exercise.  Encouraged healthy diet.  Orders: -     POCT glycosylated hemoglobin (Hb A1C)  Hyperlipidemia, unspecified hyperlipidemia type Assessment & Plan: Chronic issue.  Continue pravastatin 20 mg daily.  Check lipid panel.  Orders: -     Comprehensive metabolic panel -     Lipid panel  Hot flashes Assessment &  Plan: Chronic issue.  Symptoms are consistent with hot flashes.  Continues to have issues with this.  We will have her taper off of gabapentin given that it has not been beneficial.  She will take gabapentin 300 mg once daily for 2 weeks and then 300 mg once every other day for 2 weeks.  I will check with our clinical pharmacist for more information of veozah.    Requesting optho records.  Return in about 6 weeks (around 02/19/2023) for Hypertension.   Marikay Alar, MD [facility] Primary [facility]

## 2023-01-08 NOTE — Assessment & Plan Note (Signed)
Chronic issue.  Symptoms are consistent with hot flashes.  Continues to have issues with this.  We will have her taper off of gabapentin given that it has not been beneficial.  She will take gabapentin 300 mg once daily for 2 weeks and then 300 mg once every other day for 2 weeks.  I will check with our clinical pharmacist for more information of veozah.

## 2023-01-08 NOTE — Assessment & Plan Note (Signed)
Chronic issue.  Slightly above goal.  She will start checking at home.  Discussed goal blood pressure of less than 130/80.  We will have her return in 6 weeks to follow-up on this.  She will continue amlodipine 10 mg daily and valsartan 160 mg daily.

## 2023-01-08 NOTE — Patient Instructions (Signed)
Nice to see you. We are going to taper you off of gabapentin.  You will take gabapentin 300 mg once daily for 2 weeks and then take gabapentin 300 mg every other day for 2 weeks and then you can discontinue this. I am going to confer with our clinical pharmacist on the veozah.  If I feel like it is an adequate option for you we will get this sent in for you.

## 2023-01-08 NOTE — Assessment & Plan Note (Signed)
Chronic issue.  Very well-controlled.  She will continue Mounjaro 7.5 mg weekly.  Encouraged exercise.  Encouraged healthy diet.

## 2023-01-09 LAB — LIPID PANEL
Chol/HDL Ratio: 2.2 ratio (ref 0.0–4.4)
Cholesterol, Total: 139 mg/dL (ref 100–199)
HDL: 64 mg/dL (ref >39)
LDL Chol Calc (NIH): 57 mg/dL (ref 0–99)
Triglycerides: 97 mg/dL (ref 0–149)
VLDL Cholesterol Cal: 18 mg/dL (ref 5–40)

## 2023-01-09 LAB — COMPREHENSIVE METABOLIC PANEL
ALT: 17 IU/L (ref 0–32)
AST: 20 IU/L (ref 0–40)
Albumin: 4.5 g/dL (ref 3.8–4.9)
Alkaline Phosphatase: 83 IU/L (ref 44–121)
BUN/Creatinine Ratio: 15 (ref 9–23)
BUN: 12 mg/dL (ref 6–24)
Bilirubin Total: 0.4 mg/dL (ref 0.0–1.2)
CO2: 23 mmol/L (ref 20–29)
Calcium: 9.7 mg/dL (ref 8.7–10.2)
Chloride: 106 mmol/L (ref 96–106)
Creatinine, Ser: 0.82 mg/dL (ref 0.57–1.00)
Globulin, Total: 2.8 g/dL (ref 1.5–4.5)
Glucose: 99 mg/dL (ref 70–99)
Potassium: 4.4 mmol/L (ref 3.5–5.2)
Sodium: 143 mmol/L (ref 134–144)
Total Protein: 7.3 g/dL (ref 6.0–8.5)
eGFR: 84 mL/min/{1.73_m2} (ref >59)

## 2023-01-15 ENCOUNTER — Other Ambulatory Visit: Payer: Self-pay | Admitting: Family Medicine

## 2023-01-15 DIAGNOSIS — F419 Anxiety disorder, unspecified: Secondary | ICD-10-CM

## 2023-01-21 ENCOUNTER — Telehealth: Payer: Self-pay | Admitting: Family Medicine

## 2023-01-21 DIAGNOSIS — R232 Flushing: Secondary | ICD-10-CM

## 2023-01-21 NOTE — Telephone Encounter (Signed)
Spoke to Patient she is willing to try the [facility] and scheduled for labs 3, 6 &9 months out.

## 2023-01-21 NOTE — Telephone Encounter (Signed)
Please let the patient know that I heard back from our pharmacist on the medication for hot flashes.  This could be a good option for her.  There is some risk to the liver with this kind of medication and thus we would have to monitor her liver function after 3 months, 6 months, and 9 months.  If she is willing to start on the veozah I can send it to her pharmacy and we need to get her set up with lab appointments for 3 months, 6 months, and 9 months from now.

## 2023-01-21 NOTE — Telephone Encounter (Signed)
-----   Message from [employer] sent at 01/08/2023  4:55 PM EDT ----- Elvina Sidle,   I actually hadn't heard about this one until Clent Ridges mentioned it to me the other month.   I think it could be a good option. Would stick with the recommended liver monitoring - after initiation of therapy at 3, 6, and 9 months.   Now, I'm not sure anything about coverage/cost. 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   [name] ----- Message ----- From: Glori Luis, MD Sent: 01/08/2023   9:13 AM EDT To: Alden Hipp, RPH-CPP  Hey [name], this patient asked about veozah for hot flashes. I have never heard of this medication, though looked it up in [employer] and it seems to be a good option. I wanted to see if you knew anything about it as I had not heard of it previously. Thanks.   Minerva Areola

## 2023-01-24 MED ORDER — VEOZAH 45 MG PO TABS
45.0000 mg | ORAL_TABLET | Freq: Every day | ORAL | 3 refills | Status: DC
Start: 2023-01-24 — End: 2023-03-27

## 2023-01-24 NOTE — Telephone Encounter (Signed)
Noted.  I sent the medication to her local pharmacy.  Labs ordered for the 79-month visit.

## 2023-01-24 NOTE — Addendum Note (Signed)
Addended by: Birdie Sons Ellizabeth Dacruz G on: 01/24/2023 12:41 PM   Modules accepted: Orders

## 2023-01-24 NOTE — Telephone Encounter (Signed)
Sent a my chart message to let Patient know the Allyne Gee was sent in for her.

## 2023-01-29 ENCOUNTER — Encounter (INDEPENDENT_AMBULATORY_CARE_PROVIDER_SITE_OTHER): Payer: Self-pay

## 2023-02-19 ENCOUNTER — Encounter: Payer: Self-pay | Admitting: Family Medicine

## 2023-02-19 ENCOUNTER — Encounter: Payer: Self-pay | Admitting: Pharmacist

## 2023-02-19 ENCOUNTER — Ambulatory Visit: Payer: 59 | Admitting: Family Medicine

## 2023-02-19 VITALS — BP 128/82 | HR 97 | Temp 97.7°F | Ht 70.0 in | Wt 226.8 lb

## 2023-02-19 DIAGNOSIS — R232 Flushing: Secondary | ICD-10-CM

## 2023-02-19 DIAGNOSIS — F419 Anxiety disorder, unspecified: Secondary | ICD-10-CM

## 2023-02-19 DIAGNOSIS — F32A Depression, unspecified: Secondary | ICD-10-CM | POA: Diagnosis not present

## 2023-02-19 DIAGNOSIS — I10 Essential (primary) hypertension: Secondary | ICD-10-CM | POA: Diagnosis not present

## 2023-02-19 MED ORDER — VENLAFAXINE HCL ER 37.5 MG PO CP24
ORAL_CAPSULE | ORAL | 1 refills | Status: DC
Start: 2023-02-19 — End: 2023-07-04

## 2023-02-19 NOTE — Assessment & Plan Note (Signed)
>>  ASSESSMENT AND PLAN FOR ANXIETY AND DEPRESSION WRITTEN ON 02/19/2023  9:36 AM BY MARIBETH, ERIC G, MD  Chronic issue.  Not controlled.  We will have her reduce her Lexapro  to 10 mg daily for 14 days and started on Effexor  37.5 mg daily for 14 days.  At the end of 14 days she will discontinue the Lexapro  and increase Effexor  to 75 mg daily.  If she notices any odd side effects she will let us  know.  Discussed she should not suddenly stop Effexor  given risk of withdrawal symptoms.  Advised that Effexor  could help with her hot flashes though also has a side effect of causing hot flashes.

## 2023-02-19 NOTE — Progress Notes (Signed)
Marikay Alar, MD Phone: [phone]  Rebecca Hobbs is a 57 y.o. female who presents today for f/u.  HYPERTENSION Disease Monitoring Home BP Monitoring not checking, has not been able to due to work schedule Chest pain- no    Dyspnea- no Medications Compliance-  taking amlodipine, valsartan.  Edema- no BMET    Component Value Date/Time   NA 143 01/08/2023 0911   K 4.4 01/08/2023 0911   CL 106 01/08/2023 0911   CO2 23 01/08/2023 0911   GLUCOSE 99 01/08/2023 0911   GLUCOSE 104 (H) 09/12/2021 0947   BUN 12 01/08/2023 0911   CREATININE 0.82 01/08/2023 0911   CALCIUM 9.7 01/08/2023 0911   GFRNONAA 64 04/15/2017 0830   GFRAA 74 04/15/2017 0830   Hot flashes: Patient unable to get Veozah due to cost.  Wondering about assistance for this.  Anxiety/depression: Patient feels the Lexapro is not helpful.  She notes no SI.  Social History   Tobacco Use  Smoking Status Never  Smokeless Tobacco Never    Current Outpatient Medications on File Prior to Visit  Medication Sig Dispense Refill   amLODipine (NORVASC) 10 MG tablet TAKE 1 TABLET BY MOUTH DAILY 90 tablet 3   glucose blood (ONETOUCH VERIO) test strip Use as instructed for CBG testing twice daily. E11.9. Quantity Sufficient for 90 day supply 200 each 3   MOUNJARO 7.5 MG/0.5ML Pen INJECT THE CONTENTS OF ONE PEN  SUBCUTANEOUSLY WEEKLY AS  DIRECTED 6 mL 3   Multiple Vitamin (MULTIVITAMIN) tablet Take 1 tablet by mouth daily.     pravastatin (PRAVACHOL) 20 MG tablet TAKE 1 TABLET BY MOUTH  DAILY 90 tablet 3   saccharomyces boulardii (FLORASTOR) 250 MG capsule Take 1 capsule (250 mg total) by mouth 2 (two) times daily. 14 capsule 0   valsartan (DIOVAN) 160 MG tablet TAKE 1 TABLET BY MOUTH DAILY 90 tablet 3   Fezolinetant (VEOZAH) 45 MG TABS Take 1 tablet (45 mg total) by mouth daily. (Patient not taking: Reported on 02/19/2023) 30 tablet 3   No current facility-administered medications on file prior to visit.     ROS see  history of present illness  Objective  Physical Exam Vitals:   02/19/23 0919 02/19/23 0942  BP: 124/82 128/82  Pulse: 97   Temp: 97.7 F (36.5 C)   SpO2: 97%     BP Readings from Last 3 Encounters:  02/19/23 128/82  01/08/23 128/82  07/10/22 118/70   Wt Readings from Last 3 Encounters:  02/19/23 226 lb 12.8 oz (102.9 kg)  01/08/23 232 lb 12.8 oz (105.6 kg)  07/10/22 237 lb 3.2 oz (107.6 kg)    Physical Exam Constitutional:      General: She is not in acute distress.    Appearance: She is not diaphoretic.  Cardiovascular:     Rate and Rhythm: Normal rate and regular rhythm.     Heart sounds: Normal heart sounds.  Pulmonary:     Effort: Pulmonary effort is normal.     Breath sounds: Normal breath sounds.  Skin:    General: Skin is warm and dry.  Neurological:     Mental Status: She is alert.      Assessment/Plan: Please see individual problem list.  Hot flashes Assessment & Plan: Chronic issue.  Will see if clinical pharmacy knows about any assistance for [facility].  We are also starting Effexor to see if that would be beneficial for this as well.   Anxiety and depression Assessment & Plan: Chronic issue.  Not controlled.  We will have her reduce her Lexapro to 10 mg daily for 14 days and started on Effexor 37.5 mg daily for 14 days.  At the end of 14 days she will discontinue the Lexapro and increase Effexor to 75 mg daily.  If she notices any odd side effects she will let us know.  Discussed she should not suddenly stop Effexor given risk of withdrawal symptoms.  Advised that Effexor could help with her hot flashes though also has a side effect of causing hot flashes.  Orders: -     Venlafaxine HCl ER; Take 1 capsule (37.5 mg total) by mouth daily with breakfast for 14 days, THEN 2 capsules (75 mg total) daily with breakfast.  Dispense: 180 capsule; Refill: 1  Primary hypertension Assessment & Plan: Chronic issue.  Generally controlled today.  She will continue  amlodipine 10 mg daily and valsartan 160 mg daily.  Encouraged her to start checking her blood pressure at home.     Return in about 2 months (around 04/21/2023) for Anxiety/depression/hot flashes.   Marikay Alar, MD [facility] Primary Care [facility]

## 2023-02-19 NOTE — Assessment & Plan Note (Signed)
Chronic issue.  Not controlled.  We will have her reduce her Lexapro to 10 mg daily for 14 days and started on Effexor 37.5 mg daily for 14 days.  At the end of 14 days she will discontinue the Lexapro and increase Effexor to 75 mg daily.  If she notices any odd side effects she will let us know.  Discussed she should not suddenly stop Effexor given risk of withdrawal symptoms.  Advised that Effexor could help with her hot flashes though also has a side effect of causing hot flashes.

## 2023-02-19 NOTE — Telephone Encounter (Signed)
Please call the patient and make sure she looks at the message from the clinical pharmacist on the veozah savings card.

## 2023-02-19 NOTE — Assessment & Plan Note (Signed)
Chronic issue.  Generally controlled today.  She will continue amlodipine 10 mg daily and valsartan 160 mg daily.  Encouraged her to start checking her blood pressure at home.

## 2023-02-19 NOTE — Assessment & Plan Note (Signed)
Chronic issue.  Will see if clinical pharmacy knows about any assistance for [facility].  We are also starting Effexor to see if that would be beneficial for this as well.

## 2023-02-19 NOTE — Patient Instructions (Addendum)
Nice to see you. We are going to have you reduce your escitalopram dose to 10 mg (half a tablet) daily for 2 weeks and at the same time start on Effexor 37.5 mg (1 tablet) daily for 2 weeks.  After 2 weeks you will discontinue escitalopram and you will increase the Effexor to 75 mg (2 tablets) once daily.   Please start checking your blood pressure at home.  Your goal is less than 130/80.

## 2023-02-24 NOTE — Telephone Encounter (Signed)
Lvm for pt to check mychart messages in  regards to savings coupon

## 2023-02-27 ENCOUNTER — Telehealth: Payer: Self-pay

## 2023-02-27 ENCOUNTER — Other Ambulatory Visit (HOSPITAL_COMMUNITY): Payer: Self-pay

## 2023-02-27 NOTE — Telephone Encounter (Signed)
*  Primary  Pharmacy Patient Advocate Encounter   Received notification from CoverMyMeds that prior authorization for Venlafaxine HCl ER 37.5MG  er capsules  is required/requested.   Insurance verification completed.   The patient is insured through [facility] .   Per test claim: PA required; PA submitted to [facility] via CoverMyMeds Key/confirmation #/EOC [id number] Status is pending

## 2023-03-03 ENCOUNTER — Other Ambulatory Visit: Payer: Self-pay | Admitting: Family Medicine

## 2023-03-03 DIAGNOSIS — I10 Essential (primary) hypertension: Secondary | ICD-10-CM

## 2023-03-20 ENCOUNTER — Other Ambulatory Visit: Payer: Self-pay

## 2023-03-20 DIAGNOSIS — E785 Hyperlipidemia, unspecified: Secondary | ICD-10-CM

## 2023-03-20 MED ORDER — PRAVASTATIN SODIUM 20 MG PO TABS
20.0000 mg | ORAL_TABLET | Freq: Every day | ORAL | 1 refills | Status: DC
Start: 2023-03-20 — End: 2023-08-07

## 2023-03-27 MED ORDER — VEOZAH 45 MG PO TABS: 45.0000 mg | ORAL_TABLET | Freq: Every day | ORAL | 3 refills | Status: DC

## 2023-03-27 NOTE — Addendum Note (Signed)
Addended by: Kristie Cowman on: 03/27/2023 12:29 PM   Modules accepted: Orders

## 2023-03-27 NOTE — Telephone Encounter (Signed)
Pharmacy Patient Advocate Encounter  Received notification from [facility] that Prior Authorization for Venlafaxine HCL ER 37.5 capsules has been APPROVED from 02/28/2023 to 03/13/2023

## 2023-04-21 ENCOUNTER — Ambulatory Visit: Payer: 59 | Admitting: Family Medicine

## 2023-04-21 ENCOUNTER — Encounter: Payer: Self-pay | Admitting: Family Medicine

## 2023-04-21 VITALS — BP 114/70 | HR 102 | Temp 97.9°F | Ht 70.0 in | Wt 227.0 lb

## 2023-04-21 DIAGNOSIS — R232 Flushing: Secondary | ICD-10-CM

## 2023-04-21 DIAGNOSIS — F32A Depression, unspecified: Secondary | ICD-10-CM

## 2023-04-21 DIAGNOSIS — F419 Anxiety disorder, unspecified: Secondary | ICD-10-CM

## 2023-04-21 NOTE — Assessment & Plan Note (Signed)
>>  ASSESSMENT AND PLAN FOR ANXIETY AND DEPRESSION WRITTEN ON 04/21/2023  9:41 AM BY MARIBETH, ERIC G, MD  Chronic issue.  Somewhat improved.  Patient will continue Effexor  75 mg daily.  Discussed the potential for increasing the dose though she did not opt to do this today.  If she has worsening anxiety or depression or if she is not satisfied with where her symptoms are she will let us  know.  Follow-up in 3 months.

## 2023-04-21 NOTE — Assessment & Plan Note (Signed)
Chronic issue.  Somewhat improved.  Patient will continue Effexor 75 mg daily.  Discussed the potential for increasing the dose though she did not opt to do this today.  If she has worsening anxiety or depression or if she is not satisfied with where her symptoms are she will let us know.  Follow-up in 3 months.

## 2023-04-21 NOTE — Assessment & Plan Note (Signed)
Chronic issue.  Patient will get the Veozah 45 mg daily when she is able to.  Discussed the need for checking liver enzymes at 3, 6, and 9 months.  Discussed the risk of liver damage from this medication.  Advised to let us know if she has abdominal pain, jaundice, or feels poorly in any manner.

## 2023-04-21 NOTE — Progress Notes (Signed)
Marikay Alar, MD Phone: [phone]  Rebecca Hobbs is a 57 y.o. female who presents today for f/u.  Anxiety/depression: Patient reports this is somewhat better.  She is happy with where things stand on this.  No SI.     04/21/2023    9:34 AM 02/19/2023    9:28 AM 01/08/2023    8:52 AM 07/10/2022    9:33 AM 03/20/2022    9:10 AM  Depression screen PHQ 2/9  Decreased Interest 1 1 1  0 1  Down, Depressed, Hopeless 1 2 1  0 1  PHQ - 2 Score 2 3 2  0 2  Altered sleeping 1 2 2  3   Tired, decreased energy 2 2 2  2   Change in appetite 1 1 1  1   Feeling bad or failure about yourself  0 0 0  0  Trouble concentrating 1 0 1  1  Moving slowly or fidgety/restless 2 0 0  1  Suicidal thoughts 0 0 0  0  PHQ-9 Score 9 8 8  10   Difficult doing work/chores Somewhat difficult Somewhat difficult Somewhat difficult        04/21/2023    9:34 AM 02/19/2023    9:28 AM 01/08/2023    8:52 AM 07/10/2022    9:34 AM  GAD 7 : Generalized Anxiety Score  Nervous, Anxious, on Edge 2 1 1  0  Control/stop worrying 1 1 1  0  Worry too much - different things 1 2 1  0  Trouble relaxing 1 1 1  0  Restless 1 1 1  0  Easily annoyed or irritable 1 3 2  0  Afraid - awful might happen 1 0 0 0  Total GAD 7 Score 8 9 7  0  Anxiety Difficulty Somewhat difficult Somewhat difficult Somewhat difficult Not difficult at all   Hot flashes: These continue to be an issue.  She has not gotten the [employer]  yet.  She still waiting to get it from the pharmacy as she had to get a coupon card for this.  She notes the hot flashes are no better or worse.  She notes the Effexor has not been beneficial for the hot flashes.   Social History   Tobacco Use  Smoking Status Never  Smokeless Tobacco Never    Current Outpatient Medications on File Prior to Visit  Medication Sig Dispense Refill   amLODipine (NORVASC) 10 MG tablet TAKE 1 TABLET BY MOUTH DAILY 90 tablet 3   Fezolinetant (VEOZAH) 45 MG TABS Take 1 tablet (45 mg total) by mouth  daily. 90 tablet 3   glucose blood (ONETOUCH VERIO) test strip Use as instructed for CBG testing twice daily. E11.9. Quantity Sufficient for 90 day supply 200 each 3   MOUNJARO 7.5 MG/0.5ML Pen INJECT THE CONTENTS OF ONE PEN  SUBCUTANEOUSLY WEEKLY AS  DIRECTED 6 mL 3   Multiple Vitamin (MULTIVITAMIN) tablet Take 1 tablet by mouth daily.     pravastatin (PRAVACHOL) 20 MG tablet Take 1 tablet (20 mg total) by mouth daily. 90 tablet 1   saccharomyces boulardii (FLORASTOR) 250 MG capsule Take 1 capsule (250 mg total) by mouth 2 (two) times daily. 14 capsule 0   valsartan (DIOVAN) 160 MG tablet TAKE 1 TABLET BY MOUTH DAILY 90 tablet 3   venlafaxine XR (EFFEXOR XR) 37.5 MG 24 hr capsule Take 1 capsule (37.5 mg total) by mouth daily with breakfast for 14 days, THEN 2 capsules (75 mg total) daily with breakfast. 180 capsule 1   No current facility-administered medications on  file prior to visit.     ROS see history of present illness  Objective  Physical Exam Vitals:   04/21/23 0922  BP: 114/70  Pulse: (!) 102  Temp: 97.9 F (36.6 C)  SpO2: 99%    BP Readings from Last 3 Encounters:  04/21/23 114/70  02/19/23 128/82  01/08/23 128/82   Wt Readings from Last 3 Encounters:  04/21/23 227 lb (103 kg)  02/19/23 226 lb 12.8 oz (102.9 kg)  01/08/23 232 lb 12.8 oz (105.6 kg)    Physical Exam Constitutional:      General: She is not in acute distress.    Appearance: She is not diaphoretic.  Cardiovascular:     Rate and Rhythm: Normal rate and regular rhythm.     Heart sounds: Normal heart sounds.  Pulmonary:     Effort: Pulmonary effort is normal.     Breath sounds: Normal breath sounds.  Skin:    General: Skin is warm and dry.  Neurological:     Mental Status: She is alert.      Assessment/Plan: Please see individual problem list.  Anxiety and depression Assessment & Plan: Chronic issue.  Somewhat improved.  Patient will continue Effexor 75 mg daily.  Discussed the  potential for increasing the dose though she did not opt to do this today.  If she has worsening anxiety or depression or if she is not satisfied with where her symptoms are she will let us know.  Follow-up in 3 months.   Hot flashes Assessment & Plan: Chronic issue.  Patient will get the Veozah 45 mg daily when she is able to.  Discussed the need for checking liver enzymes at 3, 6, and 9 months.  Discussed the risk of liver damage from this medication.  Advised to let us know if she has abdominal pain, jaundice, or feels poorly in any manner.      Return in about 3 months (around 07/22/2023) for anxiety/depression.   Marikay Alar, MD [facility]

## 2023-04-23 ENCOUNTER — Other Ambulatory Visit: Payer: 59

## 2023-06-17 ENCOUNTER — Other Ambulatory Visit: Payer: Self-pay | Admitting: Family Medicine

## 2023-06-17 DIAGNOSIS — R232 Flushing: Secondary | ICD-10-CM

## 2023-07-02 IMAGING — MG MM DIGITAL SCREENING BILAT W/ TOMO AND CAD
8 series · 8 of 24 positions shown · non-contrast
Comparison: Previous exam(s).

CLINICAL DATA: Screening.

EXAM:
DIGITAL SCREENING BILATERAL MAMMOGRAM WITH TOMOSYNTHESIS AND CAD
TECHNIQUE: Bilateral screening digital craniocaudal and mediolateral oblique
mammograms were obtained. Bilateral screening digital breast
tomosynthesis was performed. The images were evaluated with
computer-aided detection.

[L MLO synth-2D]
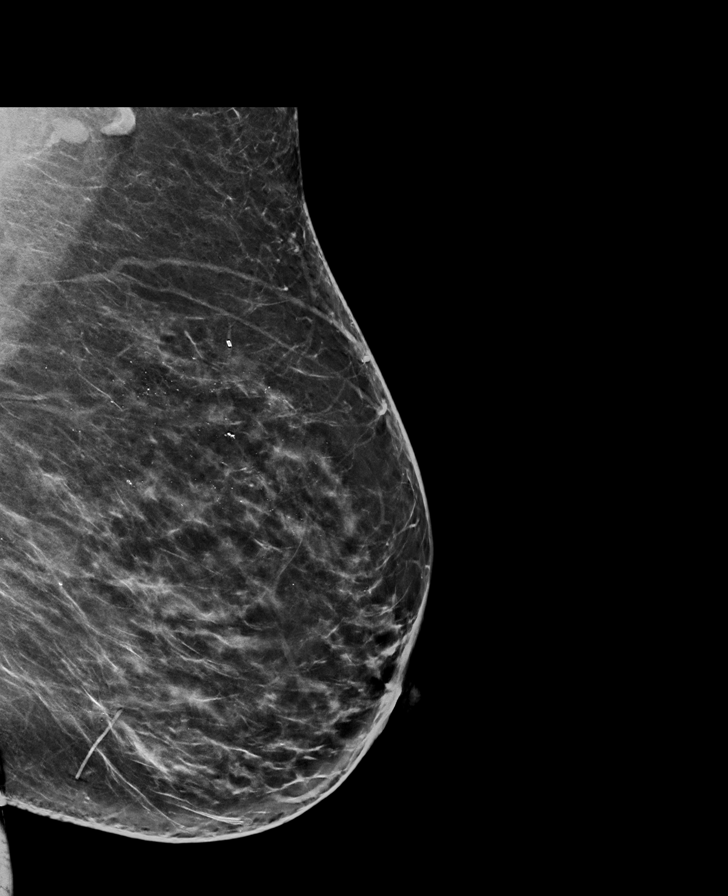

[L CC synth-2D]
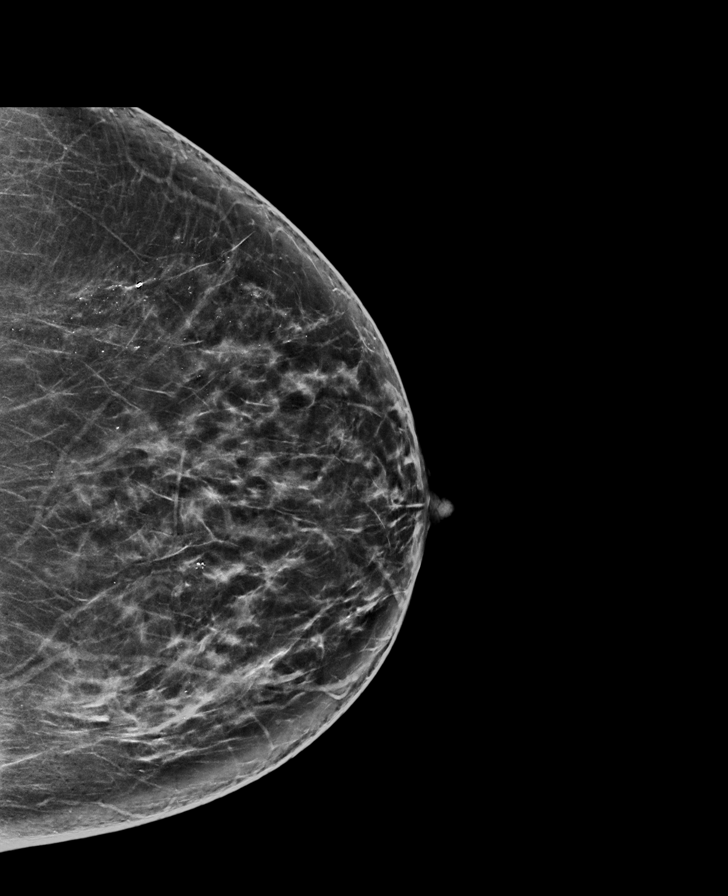

[R MLO synth-2D]
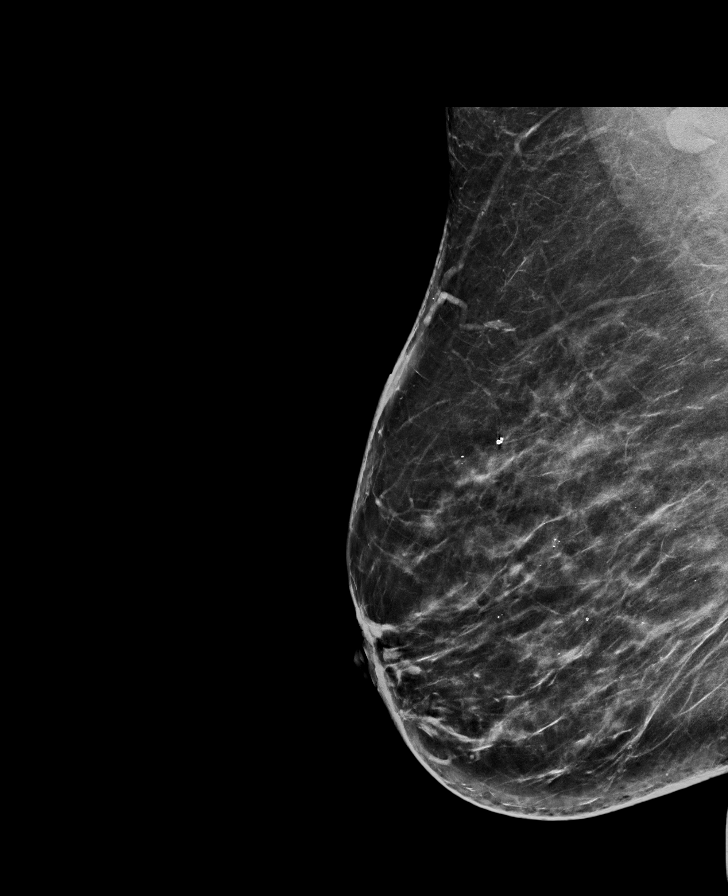

[R CC synth-2D]
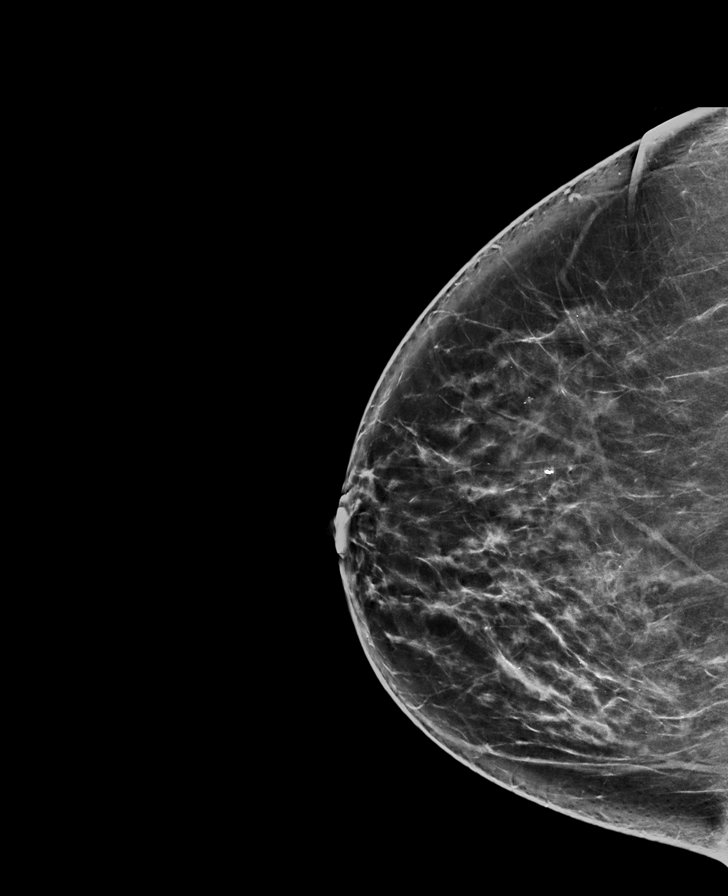

[R MLO tomo · tomo slice 43/84.0]
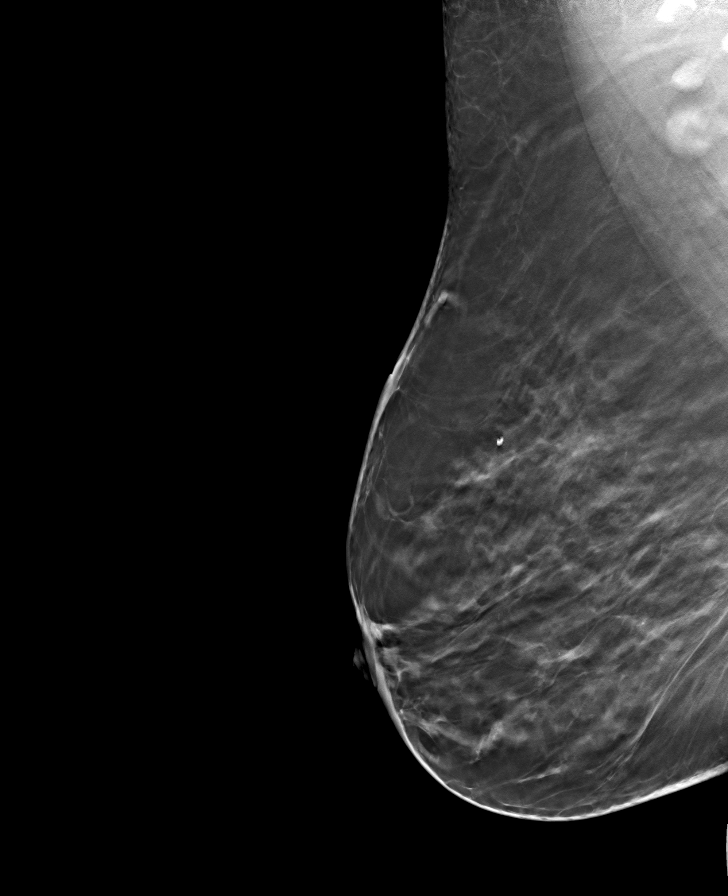

[L MLO tomo · tomo slice 43/85.0]
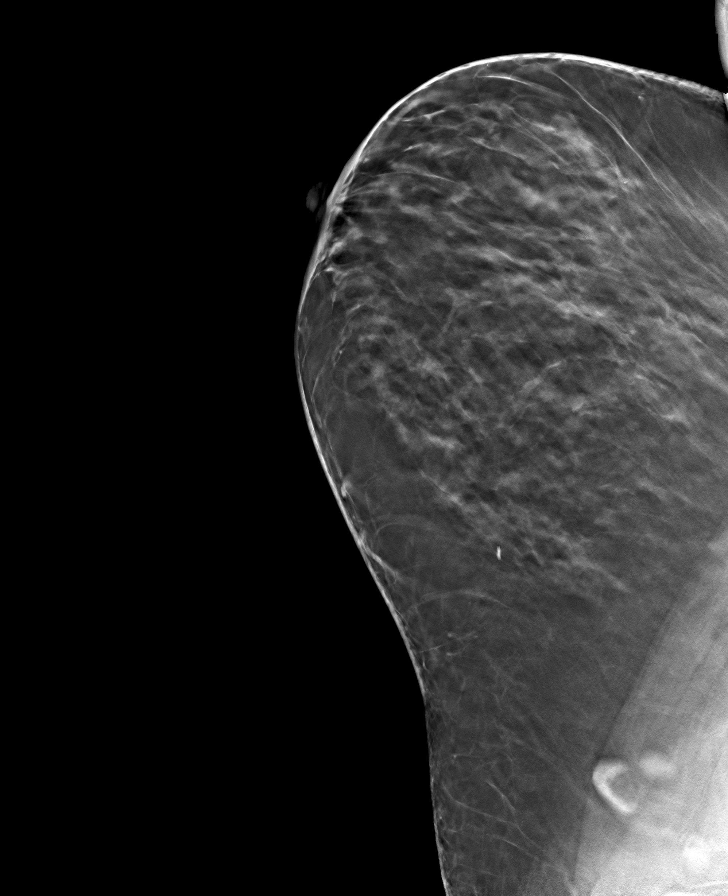

[R CC tomo · tomo slice 41/82.0]
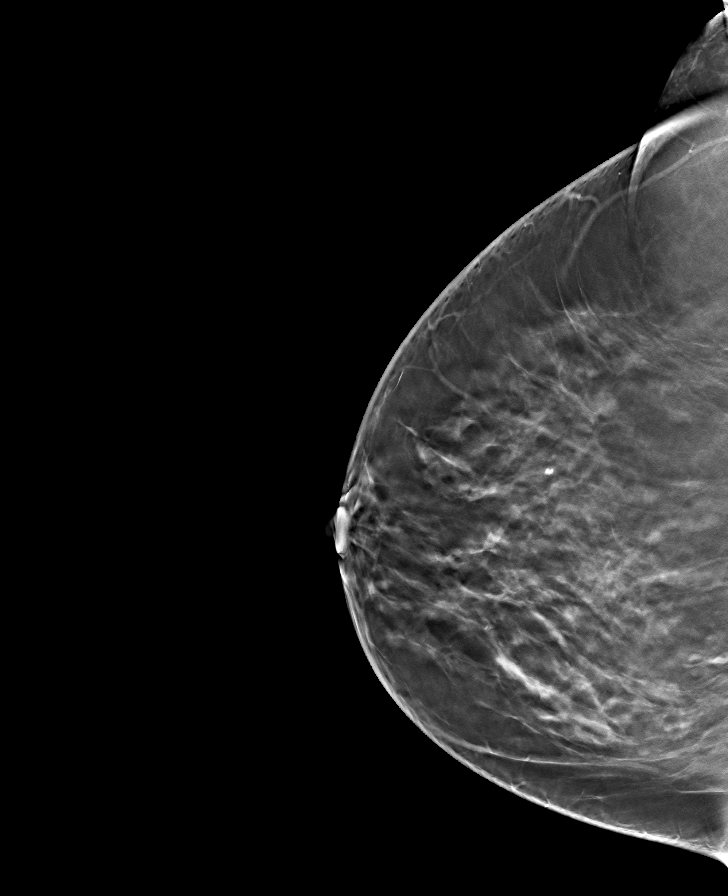

[L CC tomo · tomo slice 41/82.0]
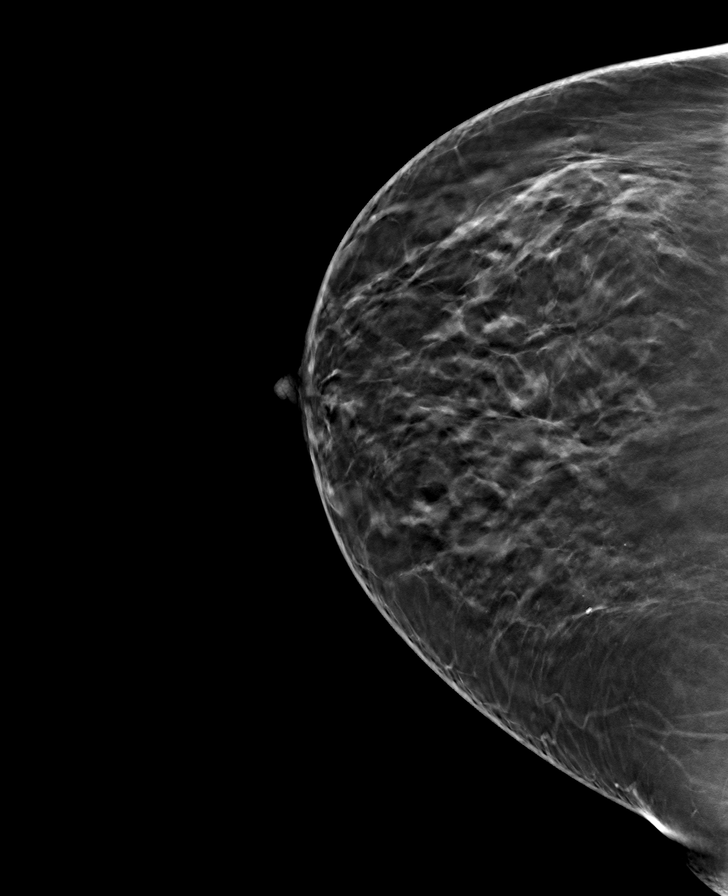

[8 of 24 positions shown; findings below may reference images not displayed]

ACR Breast Density Category c: The breast tissue is heterogeneously
dense, which may obscure small masses.
FINDINGS: There are no findings suspicious for malignancy.
IMPRESSION: No mammographic evidence of malignancy. A result letter of this
screening mammogram will be mailed directly to the patient.

RECOMMENDATION:
Screening mammogram in one year. (Code:Q3-W-BC3)

BI-RADS CATEGORY  1: Negative.

## 2023-07-04 ENCOUNTER — Other Ambulatory Visit: Payer: Self-pay | Admitting: Family Medicine

## 2023-07-04 DIAGNOSIS — F32A Depression, unspecified: Secondary | ICD-10-CM

## 2023-07-08 MED ORDER — VENLAFAXINE HCL ER 37.5 MG PO CP24: ORAL_CAPSULE | ORAL | 1 refills | Status: DC

## 2023-07-22 ENCOUNTER — Encounter: Payer: Self-pay | Admitting: Family Medicine

## 2023-07-22 ENCOUNTER — Ambulatory Visit: Payer: 59 | Admitting: Family Medicine

## 2023-07-22 VITALS — BP 126/76 | HR 98 | Temp 97.8°F | Ht 70.0 in | Wt 227.4 lb

## 2023-07-22 DIAGNOSIS — R232 Flushing: Secondary | ICD-10-CM | POA: Diagnosis not present

## 2023-07-22 DIAGNOSIS — F419 Anxiety disorder, unspecified: Secondary | ICD-10-CM | POA: Diagnosis not present

## 2023-07-22 DIAGNOSIS — Z1231 Encounter for screening mammogram for malignant neoplasm of breast: Secondary | ICD-10-CM

## 2023-07-22 DIAGNOSIS — E119 Type 2 diabetes mellitus without complications: Secondary | ICD-10-CM | POA: Diagnosis not present

## 2023-07-22 DIAGNOSIS — F32A Depression, unspecified: Secondary | ICD-10-CM

## 2023-07-22 DIAGNOSIS — Z7985 Long-term (current) use of injectable non-insulin antidiabetic drugs: Secondary | ICD-10-CM

## 2023-07-22 MED ORDER — VENLAFAXINE HCL ER 75 MG PO CP24
75.0000 mg | ORAL_CAPSULE | Freq: Every day | ORAL | 1 refills | Status: DC
Start: 2023-07-22 — End: 2024-02-03

## 2023-07-22 NOTE — Assessment & Plan Note (Signed)
Chronic issue.  Previously well-controlled.  Check A1c.  Continue Mounjaro 7.5 mg weekly.

## 2023-07-22 NOTE — Assessment & Plan Note (Signed)
Chronic issue.  Had been improved.  Will refill Effexor 75 mg daily.

## 2023-07-22 NOTE — Assessment & Plan Note (Signed)
Chronic issue.  Improved.  Patient will continue Effexor 75 mg daily.

## 2023-07-22 NOTE — Progress Notes (Signed)
Marikay Alar, MD Phone: [phone]  Rebecca Hobbs is a 58 y.o. female who presents today for f/u.  Anxiety/depression: Patient notes she was doing quite well with this until she came off of her Effexor a couple of weeks ago due to her insurance not filling the prescription.  She notes she has had some anxiety and depression since coming off of this.  No SI.  Hot flashes: Patient notes she never got on the [facility].  She notes her hot flashes have improved some and she thinks the Effexor has been beneficial for this.  Social History   Tobacco Use  Smoking Status Never  Smokeless Tobacco Never    Current Outpatient Medications on File Prior to Visit  Medication Sig Dispense Refill   amLODipine (NORVASC) 10 MG tablet TAKE 1 TABLET BY MOUTH DAILY 90 tablet 3   Fezolinetant (VEOZAH) 45 MG TABS Take 1 tablet (45 mg total) by mouth daily. 90 tablet 3   glucose blood (ONETOUCH VERIO) test strip Use as instructed for CBG testing twice daily. E11.9. Quantity Sufficient for 90 day supply 200 each 3   MOUNJARO 7.5 MG/0.5ML Pen INJECT THE CONTENTS OF ONE PEN  SUBCUTANEOUSLY WEEKLY AS  DIRECTED 6 mL 3   Multiple Vitamin (MULTIVITAMIN) tablet Take 1 tablet by mouth daily.     pravastatin (PRAVACHOL) 20 MG tablet Take 1 tablet (20 mg total) by mouth daily. 90 tablet 1   saccharomyces boulardii (FLORASTOR) 250 MG capsule Take 1 capsule (250 mg total) by mouth 2 (two) times daily. 14 capsule 0   valsartan (DIOVAN) 160 MG tablet TAKE 1 TABLET BY MOUTH DAILY 90 tablet 3   No current facility-administered medications on file prior to visit.     ROS see history of present illness  Objective  Physical Exam Vitals:   07/22/23 0856 07/22/23 0917  BP: 116/82 126/76  Pulse: 98   Temp: 97.8 F (36.6 C)   SpO2: 99%     BP Readings from Last 3 Encounters:  07/22/23 126/76  04/21/23 114/70  02/19/23 128/82   Wt Readings from Last 3 Encounters:  07/22/23 227 lb 6.4 oz (103.1 kg)  04/21/23  227 lb (103 kg)  02/19/23 226 lb 12.8 oz (102.9 kg)    Physical Exam Constitutional:      General: She is not in acute distress.    Appearance: She is not diaphoretic.  Cardiovascular:     Rate and Rhythm: Normal rate and regular rhythm.     Heart sounds: Normal heart sounds.  Pulmonary:     Effort: Pulmonary effort is normal.     Breath sounds: Normal breath sounds.  Skin:    General: Skin is warm and dry.  Neurological:     Mental Status: She is alert.      Assessment/Plan: Please see individual problem list.  Anxiety and depression Assessment & Plan: Chronic issue.  Had been improved.  Will refill Effexor 75 mg daily.  Orders: -     Venlafaxine HCl ER; Take 1 capsule (75 mg total) by mouth daily with breakfast.  Dispense: 90 capsule; Refill: 1  Hot flashes Assessment & Plan: Chronic issue.  Improved.  Patient will continue Effexor 75 mg daily.   Type 2 diabetes mellitus without complication, without long-term current use of insulin (HCC) Assessment & Plan: Chronic issue.  Previously well-controlled.  Check A1c.  Continue Mounjaro 7.5 mg weekly.  Orders: -     Basic metabolic panel -     Hemoglobin A1c  Encounter for screening mammogram for malignant neoplasm of breast -     3D Screening Mammogram, Left and Right; Future  Patient will call to schedule her mammogram.   Return in about 6 months (around 01/19/2024) for Transfer of care.   Marikay Alar, MD [facility] Primary Care [facility]

## 2023-07-22 NOTE — Addendum Note (Signed)
Addended by: Jarvis Morgan D on: 07/22/2023 09:20 AM   Modules accepted: Orders

## 2023-07-22 NOTE — Assessment & Plan Note (Signed)
>>  ASSESSMENT AND PLAN FOR ANXIETY AND DEPRESSION WRITTEN ON 07/22/2023  9:14 AM BY MARIBETH, ERIC G, MD  Chronic issue.  Had been improved.  Will refill Effexor  75 mg daily.

## 2023-07-23 ENCOUNTER — Encounter: Payer: Self-pay | Admitting: Family Medicine

## 2023-07-23 LAB — BASIC METABOLIC PANEL
BUN/Creatinine Ratio: 14 (ref 9–23)
BUN: 13 mg/dL (ref 6–24)
CO2: 23 mmol/L (ref 20–29)
Calcium: 9.9 mg/dL (ref 8.7–10.2)
Chloride: 103 mmol/L (ref 96–106)
Creatinine, Ser: 0.92 mg/dL (ref 0.57–1.00)
Glucose: 104 mg/dL — ABNORMAL HIGH (ref 70–99)
Potassium: 4.7 mmol/L (ref 3.5–5.2)
Sodium: 141 mmol/L (ref 134–144)
eGFR: 73 mL/min/{1.73_m2} (ref >59)

## 2023-07-23 LAB — HEMOGLOBIN A1C
Est. average glucose Bld gHb Est-mCnc: 126 mg/dL
Hgb A1c MFr Bld: 6 % — ABNORMAL HIGH (ref 4.8–5.6)

## 2023-07-24 ENCOUNTER — Other Ambulatory Visit: Payer: Self-pay | Admitting: Family Medicine

## 2023-07-24 DIAGNOSIS — E119 Type 2 diabetes mellitus without complications: Secondary | ICD-10-CM

## 2023-07-29 ENCOUNTER — Other Ambulatory Visit (HOSPITAL_COMMUNITY): Payer: Self-pay

## 2023-07-29 ENCOUNTER — Telehealth: Payer: Self-pay

## 2023-07-29 NOTE — Telephone Encounter (Signed)
Pharmacy Patient Advocate Encounter   Received notification from CoverMyMeds that prior authorization for Venlafaxine HCl ER 37.5MG  er capsules is required/requested.   Insurance verification completed.   The patient is insured through [facility] .   Per test claim: PA required; PA submitted to above mentioned insurance via CoverMyMeds Key/confirmation #/EOC [id number] Status is pending

## 2023-07-30 ENCOUNTER — Other Ambulatory Visit: Payer: 59

## 2023-08-04 ENCOUNTER — Other Ambulatory Visit (HOSPITAL_COMMUNITY): Payer: Self-pay

## 2023-08-07 ENCOUNTER — Other Ambulatory Visit: Payer: Self-pay | Admitting: Family Medicine

## 2023-08-07 DIAGNOSIS — E785 Hyperlipidemia, unspecified: Secondary | ICD-10-CM

## 2023-08-27 NOTE — Telephone Encounter (Signed)
 Pharmacy Patient Advocate Encounter  Received notification from [facility] that Prior Authorization for Venlafaxine has been DENIED.  Full denial letter will be uploaded to the media tab. See denial reason below.   PA #/Case ID/Reference #: [id number]

## 2023-09-15 ENCOUNTER — Other Ambulatory Visit: Payer: Self-pay

## 2023-09-15 DIAGNOSIS — I10 Essential (primary) hypertension: Secondary | ICD-10-CM

## 2023-09-15 MED ORDER — VALSARTAN 160 MG PO TABS: 160.0000 mg | ORAL_TABLET | Freq: Every day | ORAL | 1 refills | Status: DC

## 2023-10-14 ENCOUNTER — Other Ambulatory Visit: Payer: Self-pay

## 2023-10-14 DIAGNOSIS — E785 Hyperlipidemia, unspecified: Secondary | ICD-10-CM

## 2023-10-14 MED ORDER — PRAVASTATIN SODIUM 20 MG PO TABS: 20.0000 mg | ORAL_TABLET | Freq: Every day | ORAL | 0 refills | Status: DC

## 2023-10-22 ENCOUNTER — Other Ambulatory Visit: Payer: 59

## 2023-10-30 ENCOUNTER — Other Ambulatory Visit (HOSPITAL_COMMUNITY): Payer: Self-pay

## 2023-10-30 ENCOUNTER — Telehealth: Payer: Self-pay | Admitting: Pharmacy Technician

## 2023-10-30 NOTE — Telephone Encounter (Signed)
 Pharmacy Patient Advocate Encounter   Received notification from CoverMyMeds that prior authorization for Mounjaro  7.5 MG/0.5ML auto-injectors is required/requested.   Insurance verification completed.   The patient is insured through [facility] .   Per test claim: PA required; PA submitted to above mentioned insurance via CoverMyMeds Key/confirmation #/EOC [id number] Status is pending.  CMM renewal request.

## 2023-10-30 NOTE — Telephone Encounter (Signed)
 Pharmacy Patient Advocate Encounter  Received notification from [employer] that Prior Authorization for Mounjaro  7.5 MG/0.5ML auto-injectors has been APPROVED from 10/30/2023 to 10/29/2024.   PA #/Case ID/Reference #: [id number]

## 2023-12-20 ENCOUNTER — Other Ambulatory Visit: Payer: Self-pay | Admitting: Internal Medicine

## 2023-12-20 DIAGNOSIS — E785 Hyperlipidemia, unspecified: Secondary | ICD-10-CM

## 2023-12-22 ENCOUNTER — Other Ambulatory Visit: Payer: Self-pay | Admitting: *Deleted

## 2023-12-22 DIAGNOSIS — E119 Type 2 diabetes mellitus without complications: Secondary | ICD-10-CM

## 2023-12-22 MED ORDER — MOUNJARO 7.5 MG/0.5ML ~~LOC~~ SOAJ: SUBCUTANEOUS | 0 refills | Status: DC

## 2024-01-19 ENCOUNTER — Encounter: Payer: 59 | Admitting: Family Medicine

## 2024-02-03 ENCOUNTER — Ambulatory Visit

## 2024-02-03 ENCOUNTER — Telehealth: Payer: Self-pay | Admitting: Sleep Medicine

## 2024-02-03 VITALS — BP 110/70 | HR 110 | Temp 98.9°F | Ht 70.0 in | Wt 222.0 lb

## 2024-02-03 DIAGNOSIS — R232 Flushing: Secondary | ICD-10-CM

## 2024-02-03 DIAGNOSIS — E119 Type 2 diabetes mellitus without complications: Secondary | ICD-10-CM

## 2024-02-03 DIAGNOSIS — Z7985 Long-term (current) use of injectable non-insulin antidiabetic drugs: Secondary | ICD-10-CM

## 2024-02-03 DIAGNOSIS — Z6831 Body mass index (BMI) 31.0-31.9, adult: Secondary | ICD-10-CM

## 2024-02-03 DIAGNOSIS — F39 Unspecified mood [affective] disorder: Secondary | ICD-10-CM

## 2024-02-03 DIAGNOSIS — E782 Mixed hyperlipidemia: Secondary | ICD-10-CM

## 2024-02-03 DIAGNOSIS — E669 Obesity, unspecified: Secondary | ICD-10-CM

## 2024-02-03 DIAGNOSIS — R0683 Snoring: Secondary | ICD-10-CM

## 2024-02-03 DIAGNOSIS — F32A Depression, unspecified: Secondary | ICD-10-CM

## 2024-02-03 DIAGNOSIS — Z1231 Encounter for screening mammogram for malignant neoplasm of breast: Secondary | ICD-10-CM

## 2024-02-03 DIAGNOSIS — R42 Dizziness and giddiness: Secondary | ICD-10-CM

## 2024-02-03 DIAGNOSIS — I10 Essential (primary) hypertension: Secondary | ICD-10-CM

## 2024-02-03 DIAGNOSIS — E785 Hyperlipidemia, unspecified: Secondary | ICD-10-CM

## 2024-02-03 MED ORDER — VALSARTAN 160 MG PO TABS: 160.0000 mg | ORAL_TABLET | Freq: Every day | ORAL | 3 refills | Status: AC

## 2024-02-03 MED ORDER — AMLODIPINE BESYLATE 10 MG PO TABS: 10.0000 mg | ORAL_TABLET | Freq: Every day | ORAL | 3 refills | Status: AC

## 2024-02-03 MED ORDER — LANCETS MISC. MISC: 1.0000 | Freq: Every day | 3 refills | Status: AC

## 2024-02-03 MED ORDER — BLOOD GLUCOSE TEST VI STRP: 1.0000 | ORAL_STRIP | Freq: Every day | 11 refills | Status: AC

## 2024-02-03 MED ORDER — VENLAFAXINE HCL ER 75 MG PO CP24: 75.0000 mg | ORAL_CAPSULE | Freq: Every day | ORAL | 1 refills | Status: DC

## 2024-02-03 MED ORDER — PRAVASTATIN SODIUM 20 MG PO TABS: 20.0000 mg | ORAL_TABLET | Freq: Every day | ORAL | 3 refills | Status: AC

## 2024-02-03 MED ORDER — BLOOD GLUCOSE MONITORING SUPPL DEVI: 1.0000 | Freq: Three times a day (TID) | 0 refills | Status: AC

## 2024-02-03 MED ORDER — LANCET DEVICE MISC: 1.0000 | Freq: Every day | 0 refills | Status: AC

## 2024-02-03 MED ORDER — MOUNJARO 7.5 MG/0.5ML ~~LOC~~ SOAJ: SUBCUTANEOUS | 3 refills | Status: AC

## 2024-02-03 NOTE — Assessment & Plan Note (Signed)
 Continue lifestyle modifications with balanced diet, moderate intensity exercise: 30 min per 5 days a week or 150 min per week.

## 2024-02-03 NOTE — Assessment & Plan Note (Signed)
 BP within goal. Continue Amlodipine  10 mg, Valsartan  160 mg once a day. Check CMP today.

## 2024-02-03 NOTE — Assessment & Plan Note (Addendum)
 Chronic issue.  Symptoms stable on Effexor  XR 75 mg daily, continue. Refill sent. Check vitamin D  level.

## 2024-02-03 NOTE — Assessment & Plan Note (Addendum)
 With left tonsillar enlargement 3+ compared to right 2+. Snoring could be related or exacerbated by recent URI like symptoms. STOP-BANG score 4 ( daytime fatigue, observed apnea during sleep, HTN treatment, age). Recommend OSA evaluation, referral made. If in the future tonsillar enlargement continues to be a problem, consider ENT referral.

## 2024-02-03 NOTE — Assessment & Plan Note (Signed)
 Chronic issue. A1c has been within goal with Mounjaro  7.5 mg weekly, continue.  Check A1c, urine microalbumin level.  If A1c >6.5% recommend increasing increasing dose of Mounjaro  from 7.5 to 10 mg weekly.  Patient has a h/o hypoglycemia on Glyburide in the past. Recommend self home BG monitoring prn.

## 2024-02-03 NOTE — Patient Instructions (Signed)
 YOUR MAMMOGRAM IS DUE, PLEASE CALL AND GET THIS SCHEDULED! [facility] - call [phone]

## 2024-02-03 NOTE — Assessment & Plan Note (Signed)
 Tolerating Pravastatin  20 mg once daily (intolerant to Atorvastatin  in the past), check lipid panel and CMP today.

## 2024-02-03 NOTE — Progress Notes (Signed)
 Established Patient Office Visit TOC frm Dr. Maribeth    Subjective  Patient ID: Rebecca Hobbs, female    DOB: 06-May-1966  Age: 58 y.o. MRN: [id number]  Chief Complaint  Patient presents with   Establish Care    She  has a past medical history of Hypertension and Pseudotumor cerebri syndrome.  HPI Discussed the use of AI scribe software for clinical note transcription with the patient, who gave verbal consent to proceed.  History of Present Illness Rebecca Hobbs is a 58 year old female with hypertension and diabetes who presents for a routine follow-up visit.  - She has a history of hypertension, managed with amlodipine  10 mg at night and valsartan  160 mg in the morning.  No chest pain, weakness, or leg swelling. She adheres to her medication regimen without missing doses.  - Her diabetes is managed with Mounjaro  7.5 mg, which she has been taking for over a year. She tolerates the medication well without nausea, vomiting, or bloating, though she must watch her diet to avoid these symptoms. She does not take metformin  or any other diabetes medications at this time. She checks her blood sugar at home but has misplaced her meter. No recent episodes of hypoglycemia since starting Mounjaro , unlike when she was on glyburide. She needs new home BG monitoring device. She eats low carbohydrate diet, exercises regularly. She is due for diabetic eye exam, gets it done through [facility].   - She is on pravastatin  20 mg for hyperlipidemia, taken at night with her amlodipine . She previously experienced side effects such as myalgia and abdominal pain with atorvastatin . Her diet consists of vegetables, chicken, [place], and she avoids red meat and high-carbohydrate foods like pasta and sugar. She engages in physical activities such as walking and yard work.  - She experiences menopausal symptoms, primarily hot flashes, which improve when she avoids sugar. She is not on any specific medication for  menopause but takes venlafaxine  XR 75 mg which helps with her mood. She has not been able to obtain other medications like Vioza due to pharmacy issues.  - She has a past medical history of pseudotumor cerebri, which resolved years ago, and she has not experienced any recent headaches or vision changes. She associates the condition with past birth control use.  - She reports occasional snoring, especially when lying on her back, and has experienced a sore throat recently, which improved with saltwater gargling and acetaminophen. She also notices daytime sleepiness.   ROS As per HPI    Objective:     BP 110/70 (BP Location: Right Arm, Patient Position: Sitting, Cuff Size: Normal)   Pulse (!) 110   Temp 98.9 F (37.2 C) (Oral)   Ht 5' 10 (1.778 m)   Wt 222 lb (100.7 kg)   SpO2 98%   BMI 31.85 kg/m      02/03/2024   11:09 AM 07/22/2023    9:09 AM 04/21/2023    9:34 AM  Depression screen PHQ 2/9  Decreased Interest 1 1 1   Down, Depressed, Hopeless 1 1 1   PHQ - 2 Score 2 2 2   Altered sleeping 3 1 1   Tired, decreased energy 2 1 2   Change in appetite 1 1 1   Feeling bad or failure about yourself  0 2 0  Trouble concentrating 1 0 1  Moving slowly or fidgety/restless 0 0 2  Suicidal thoughts 0 0 0  PHQ-9 Score 9 7 9   Difficult doing work/chores Somewhat difficult Somewhat difficult Somewhat  difficult      02/03/2024   11:09 AM 07/22/2023    9:09 AM 04/21/2023    9:34 AM 02/19/2023    9:28 AM  GAD 7 : Generalized Anxiety Score  Nervous, Anxious, on Edge 2 1 2 1   Control/stop worrying 1 1 1 1   Worry too much - different things 1 1 1 2   Trouble relaxing 1 1 1 1   Restless 1 1 1 1   Easily annoyed or irritable 2 3 1 3   Afraid - awful might happen 1 1 1  0  Total GAD 7 Score 9 9 8 9   Anxiety Difficulty Somewhat difficult Somewhat difficult Somewhat difficult Somewhat difficult      02/03/2024   11:09 AM 07/22/2023    9:09 AM 04/21/2023    9:34 AM  Depression screen PHQ 2/9   Decreased Interest 1 1 1   Down, Depressed, Hopeless 1 1 1   PHQ - 2 Score 2 2 2   Altered sleeping 3 1 1   Tired, decreased energy 2 1 2   Change in appetite 1 1 1   Feeling bad or failure about yourself  0 2 0  Trouble concentrating 1 0 1  Moving slowly or fidgety/restless 0 0 2  Suicidal thoughts 0 0 0  PHQ-9 Score 9 7 9   Difficult doing work/chores Somewhat difficult Somewhat difficult Somewhat difficult      02/03/2024   11:09 AM 07/22/2023    9:09 AM 04/21/2023    9:34 AM 02/19/2023    9:28 AM  GAD 7 : Generalized Anxiety Score  Nervous, Anxious, on Edge 2 1 2 1   Control/stop worrying 1 1 1 1   Worry too much - different things 1 1 1 2   Trouble relaxing 1 1 1 1   Restless 1 1 1 1   Easily annoyed or irritable 2 3 1 3   Afraid - awful might happen 1 1 1  0  Total GAD 7 Score 9 9 8 9   Anxiety Difficulty Somewhat difficult Somewhat difficult Somewhat difficult Somewhat difficult   SDOH Screenings   Depression (PHQ2-9): Medium Risk (02/03/2024)  Financial Resource Strain: Low Risk  (06/27/2021)  Tobacco Use: Low Risk  (02/03/2024)     Physical Exam Constitutional:      Appearance: Normal appearance.  HENT:     Head: Normocephalic and atraumatic.     Right Ear: Tympanic membrane normal.     Left Ear: Tympanic membrane normal.     Mouth/Throat:     Mouth: Mucous membranes are moist.     Pharynx: Oropharynx is clear.     Tonsils: No tonsillar exudate or tonsillar abscesses. 3+ on the right. 2+ on the left.  Neck:     Thyroid : No thyroid  mass or thyroid  tenderness.  Cardiovascular:     Rate and Rhythm: Normal rate and regular rhythm.  Pulmonary:     Effort: Pulmonary effort is normal.     Breath sounds: Normal breath sounds.  Abdominal:     General: Bowel sounds are normal.     Palpations: Abdomen is soft.     Tenderness: There is no abdominal tenderness.  Musculoskeletal:     Cervical back: Neck supple. No rigidity or tenderness.     Right lower leg: No edema.     Left  lower leg: No edema.  Skin:    General: Skin is warm.  Neurological:     Mental Status: She is alert and oriented to person, place, and time.  Psychiatric:  Mood and Affect: Mood normal.        Behavior: Behavior normal.      Diabetic foot exam was performed with the following findings:   No deformities, ulcerations, or other skin breakdown Normal sensation of 10g monofilament Intact posterior tibialis and dorsalis pedis pulses     No results found for any visits on 02/03/24.  The 10-year ASCVD risk score (Arnett DK, et al., 2019) is: 5.2%     Assessment & Plan:   Type 2 diabetes mellitus without complication, without long-term current use of insulin (HCC) Assessment & Plan: Chronic issue. A1c has been within goal with Mounjaro  7.5 mg weekly, continue.  Check A1c, urine microalbumin level.  If A1c >6.5% recommend increasing increasing dose of Mounjaro  from 7.5 to 10 mg weekly.  Patient has a h/o hypoglycemia on Glyburide in the past. Recommend self home BG monitoring prn.   Orders: -     Mounjaro ; INJECT THE CONTENTS OF ONE PEN  SUBCUTANEOUSLY WEEKLY AS  DIRECTED  Dispense: 6 mL; Refill: 3 -     Hemoglobin A1c -     Microalbumin / creatinine urine ratio -     Blood Glucose Monitoring Suppl; 1 each by Does not apply route in the morning, at noon, and at bedtime. May substitute to any manufacturer covered by patient's insurance.  Dispense: 1 each; Refill: 0 -     Blood Glucose Test; 1 each by In Vitro route daily. May substitute to any manufacturer covered by patient's insurance.  Dispense: 30 each; Refill: 11 -     Lancet Device; 1 each by Does not apply route daily. May substitute to any manufacturer covered by patient's insurance.  Dispense: 1 each; Refill: 0 -     Lancets Misc.; 1 each by Does not apply route daily. May substitute to any manufacturer covered by patient's insurance.  Dispense: 100 each; Refill: 3  Mixed hyperlipidemia Assessment & Plan: Tolerating  Pravastatin  20 mg once daily (intolerant to Atorvastatin  in the past), check lipid panel and CMP today.   Orders: -     Pravastatin  Sodium; Take 1 tablet (20 mg total) by mouth daily.  Dispense: 90 tablet; Refill: 3 -     Lipid panel  Primary hypertension Assessment & Plan: BP within goal. Continue Amlodipine  10 mg, Valsartan  160 mg once a day. Check CMP today.   Orders: -     amLODIPine  Besylate; Take 1 tablet (10 mg total) by mouth daily.  Dispense: 90 tablet; Refill: 3 -     Valsartan ; Take 1 tablet (160 mg total) by mouth daily.  Dispense: 90 tablet; Refill: 3 -     Comprehensive metabolic panel with GFR  Snoring Assessment & Plan: With left tonsillar enlargement 3+ compared to right 2+. Snoring could be related or exacerbated by recent URI like symptoms. STOP-BANG score 4 ( daytime fatigue, observed apnea during sleep, HTN treatment, age). Recommend OSA evaluation, referral made. If in the future tonsillar enlargement continues to be a problem, consider ENT referral.   Orders: -     Pulmonary Visit  Encounter for screening mammogram for breast cancer Assessment & Plan: Normal diabetic foot exam, discussed diabetic foot care.   Orders: -     3D Screening Mammogram, Left and Right; Future  Encounter for diabetic foot exam [facility]) Assessment & Plan: Normal diabetic foot exam, discussed diabetic foot care.    Mood disorder [facility]) Assessment & Plan: Chronic issue.  Symptoms stable on Effexor  XR 75 mg daily, continue.  Refill sent. Check vitamin D  level.   Orders: -     Venlafaxine  HCl ER; Take 1 capsule (75 mg total) by mouth daily with breakfast.  Dispense: 90 capsule; Refill: 1 -     VITAMIN D  25 Hydroxy (Vit-D Deficiency, Fractures)  Obesity (BMI 30-39.9) Assessment & Plan: Continue lifestyle modifications with balanced diet, moderate intensity exercise: 30 min per 5 days a week or 150 min per week.   Orders: -     VITAMIN D  25 Hydroxy (Vit-D Deficiency,  Fractures)   I spent 45 minutes on the day of this face-to-face encounter reviewing the patient's medical and surgical history, medications, ongoing concerns, and reviewing the assessment and plan with the patient. This time also included counseling the patient on their health conditions and management options. Additionally, I spent time post-visit ordering and reviewing diagnostics and therapeutics with the patient.   Return in about 6 months (around 08/05/2024) for Chronic DM, HTN, MOOD f/u.   Luke Shade, MD

## 2024-02-03 NOTE — Telephone Encounter (Signed)
 LVMTCB to schedule sleep consult.

## 2024-02-03 NOTE — Assessment & Plan Note (Signed)
 Normal diabetic foot exam, discussed diabetic foot care.

## 2024-02-04 ENCOUNTER — Ambulatory Visit: Payer: Self-pay

## 2024-02-04 DIAGNOSIS — E559 Vitamin D deficiency, unspecified: Secondary | ICD-10-CM | POA: Insufficient documentation

## 2024-02-04 LAB — LIPID PANEL
Chol/HDL Ratio: 2 ratio (ref 0.0–4.4)
Cholesterol, Total: 160 mg/dL (ref 100–199)
HDL: 79 mg/dL (ref >39)
LDL Chol Calc (NIH): 68 mg/dL (ref 0–99)
Triglycerides: 68 mg/dL (ref 0–149)
VLDL Cholesterol Cal: 13 mg/dL (ref 5–40)

## 2024-02-04 LAB — COMPREHENSIVE METABOLIC PANEL WITH GFR
ALT: 20 IU/L (ref 0–32)
AST: 20 IU/L (ref 0–40)
Albumin: 4.5 g/dL (ref 3.8–4.9)
Alkaline Phosphatase: 96 IU/L (ref 44–121)
BUN/Creatinine Ratio: 11 (ref 9–23)
BUN: 9 mg/dL (ref 6–24)
Bilirubin Total: 0.2 mg/dL (ref 0.0–1.2)
CO2: 24 mmol/L (ref 20–29)
Calcium: 9.5 mg/dL (ref 8.7–10.2)
Chloride: 105 mmol/L (ref 96–106)
Creatinine, Ser: 0.84 mg/dL (ref 0.57–1.00)
Globulin, Total: 2.7 g/dL (ref 1.5–4.5)
Glucose: 98 mg/dL (ref 70–99)
Potassium: 4.5 mmol/L (ref 3.5–5.2)
Sodium: 143 mmol/L (ref 134–144)
Total Protein: 7.2 g/dL (ref 6.0–8.5)
eGFR: 81 mL/min/1.73 (ref >59)

## 2024-02-04 LAB — HEMOGLOBIN A1C
Est. average glucose Bld gHb Est-mCnc: 123 mg/dL
Hgb A1c MFr Bld: 5.9 % — ABNORMAL HIGH (ref 4.8–5.6)

## 2024-02-04 LAB — MICROALBUMIN / CREATININE URINE RATIO
Creatinine, Urine: 386.4 mg/dL
Microalb/Creat Ratio: 9 mg/g{creat} (ref 0–29)
Microalbumin, Urine: 35.5 ug/mL

## 2024-02-04 LAB — VITAMIN D 25 HYDROXY (VIT D DEFICIENCY, FRACTURES): Vit D, 25-Hydroxy: 28.4 ng/mL — ABNORMAL LOW (ref 30.0–100.0)

## 2024-02-04 MED ORDER — VITAMIN D (ERGOCALCIFEROL) 1.25 MG (50000 UNIT) PO CAPS
50000.0000 [IU] | ORAL_CAPSULE | ORAL | 0 refills | Status: DC
Start: 2024-02-04 — End: 2024-04-04

## 2024-02-10 ENCOUNTER — Ambulatory Visit (INDEPENDENT_AMBULATORY_CARE_PROVIDER_SITE_OTHER): Admitting: Sleep Medicine

## 2024-02-10 ENCOUNTER — Encounter: Payer: Self-pay | Admitting: Sleep Medicine

## 2024-02-10 VITALS — BP 130/70 | HR 106 | Temp 98.0°F | Ht 70.0 in | Wt 220.8 lb

## 2024-02-10 DIAGNOSIS — G4733 Obstructive sleep apnea (adult) (pediatric): Secondary | ICD-10-CM

## 2024-02-10 DIAGNOSIS — Z6838 Body mass index (BMI) 38.0-38.9, adult: Secondary | ICD-10-CM

## 2024-02-10 DIAGNOSIS — I10 Essential (primary) hypertension: Secondary | ICD-10-CM

## 2024-02-10 DIAGNOSIS — G4700 Insomnia, unspecified: Secondary | ICD-10-CM | POA: Diagnosis not present

## 2024-02-10 DIAGNOSIS — E669 Obesity, unspecified: Secondary | ICD-10-CM | POA: Diagnosis not present

## 2024-02-10 DIAGNOSIS — F5104 Psychophysiologic insomnia: Secondary | ICD-10-CM

## 2024-02-10 NOTE — Progress Notes (Signed)
 Name:Rebecca Hobbs MRN: [id number] DOB: 1965-10-28   CHIEF COMPLAINT:  EXCESSIVE DAYTIME SLEEPINESS   HISTORY OF PRESENT ILLNESS:  Rebecca Hobbs is a 58 y.o. w/ a h/o HTN, anxiety, depression, DMII, obesity and hyperlipidemia who present for c/o loud snoring, witnessed apnea and excessive daytime sleepiness which has been present for several years. Reports nocturnal awakenings due to unclear reasons and occasionally has difficulty falling back to sleep. Reports a 40 lb weight loss over the last year. Admits to night sweats. Denies morning headaches, RLS symptoms, dream enactment, cataplexy, hypnagogic or hypnapompic hallucinations. Denies a family history of sleep apnea. Denies drowsy driving. Drinks 1 energy drink a few times per week and tea occasionally, occasional alcohol use, denies tobacco or illicit drug use.   Bedtime 9-10 pm Sleep onset 1-2 hours Rise time 7:30 am   EPWORTH SLEEP SCORE 0    02/10/2024    9:00 AM  Results of the Epworth flowsheet  Sitting and reading 0  Watching TV 0  Sitting, inactive in a public place (e.g. a theatre or a meeting) 0  As a passenger in a car for an hour without a break 0  Lying down to rest in the afternoon when circumstances permit 0  Sitting and talking to someone 0  Sitting quietly after a lunch without alcohol 0  In a car, while stopped for a few minutes in traffic 0  Total score 0     PAST MEDICAL HISTORY :   has a past medical history of Hypertension and Pseudotumor cerebri syndrome.  has a past surgical history that includes Laparoscopic total hysterectomy (2012); Breast biopsy (Left, 1992); and Breast biopsy (Left, 10/16/2016). Prior to Admission medications   Medication Sig Start Date End Date Taking? Authorizing Provider  amLODipine  (NORVASC ) 10 MG tablet Take 1 tablet (10 mg total) by mouth daily. 02/03/24  Yes Bair, Kalpana, MD  Blood Glucose Monitoring Suppl DEVI 1 each by Does not apply route in the morning, at noon,  and at bedtime. May substitute to any manufacturer covered by patient's insurance. 02/03/24  Yes Bair, Kalpana, MD  Glucose Blood (BLOOD GLUCOSE TEST STRIPS) STRP 1 each by In Vitro route daily. May substitute to any manufacturer covered by patient's insurance. 02/03/24 01/28/25 Yes Bair, Kalpana, MD  Lancet Device MISC 1 each by Does not apply route daily. May substitute to any manufacturer covered by patient's insurance. 02/03/24 03/04/24 Yes Bair, Kalpana, MD  Lancets Misc. MISC 1 each by Does not apply route daily. May substitute to any manufacturer covered by patient's insurance. 02/03/24 03/04/24 Yes Bair, Kalpana, MD  Multiple Vitamin (MULTIVITAMIN) tablet Take 1 tablet by mouth daily.   Yes [provider]  pravastatin  (PRAVACHOL ) 20 MG tablet Take 1 tablet (20 mg total) by mouth daily. 02/03/24  Yes Bair, Kalpana, MD  tirzepatide  (MOUNJARO ) 7.5 MG/0.5ML Pen INJECT THE CONTENTS OF ONE PEN  SUBCUTANEOUSLY WEEKLY AS  DIRECTED 02/03/24  Yes Bair, Kalpana, MD  valsartan  (DIOVAN ) 160 MG tablet Take 1 tablet (160 mg total) by mouth daily. 02/03/24  Yes Bair, Kalpana, MD  venlafaxine  XR (EFFEXOR  XR) 75 MG 24 hr capsule Take 1 capsule (75 mg total) by mouth daily with breakfast. 02/03/24  Yes Bair, Luke, MD  Vitamin D , Ergocalciferol , (DRISDOL ) 1.25 MG (50000 UNIT) CAPS capsule Take 1 capsule (50,000 Units total) by mouth every 7 (seven) days. 02/04/24  Yes Bair, Kalpana, MD   Allergies  Allergen Reactions   Metformin  And Related Diarrhea and  Other (See Comments)    Diarrhea and Stomach Pain   Atorvastatin  Other (See Comments)    Stomach Pain Other reaction(s): Other (See Comments) Stomach Pain   Invokana  [Canagliflozin ] Rash   Lisinopril  Cough    FAMILY HISTORY:  family history includes Arthritis in her maternal grandfather; Breast cancer (age of onset: 44) in her maternal aunt; COPD in her father; Diabetes in her maternal grandmother and paternal grandmother; Diverticulitis in her sister; Heart  disease in her father; Heart disease (age of onset: 11) in her brother; Hypertension in her brother, brother, brother, father, and mother. SOCIAL HISTORY:  reports that she has never smoked. She has never used smokeless tobacco. She reports current alcohol use. She reports that she does not use drugs.   Review of Systems:  Gen:  Denies  fever, sweats, chills weight loss  HEENT: Denies blurred vision, double vision, ear pain, eye pain, hearing loss, nose bleeds, sore throat Cardiac:  No dizziness, chest pain or heaviness, chest tightness,edema, No JVD Resp:   No cough, -sputum production, -shortness of breath,-wheezing, -hemoptysis,  Gi: Denies swallowing difficulty, stomach pain, nausea or vomiting, diarrhea, constipation, bowel incontinence Gu:  Denies bladder incontinence, burning urine Ext:   Denies Joint pain, stiffness or swelling Skin: Denies  skin rash, easy bruising or bleeding or hives Endoc:  Denies polyuria, polydipsia , polyphagia or weight change Psych:   Denies depression, insomnia or hallucinations  Other:  All other systems negative  VITAL SIGNS: BP 130/70 (BP Location: Left Arm, Patient Position: Sitting, Cuff Size: Normal)   Pulse (!) 106   Temp 98 F (36.7 C) (Oral)   Ht 5' 10 (1.778 m)   Wt 220 lb 12.8 oz (100.2 kg)   SpO2 98%   BMI 31.68 kg/m    Physical Examination:   General Appearance: No distress  EYES PERRLA, EOM intact.   NECK Supple, No JVD Pulmonary: normal breath sounds, No wheezing.  CardiovascularNormal S1,S2.  No m/r/g.   Abdomen: Benign, Soft, non-tender. Skin:   warm, no rashes, no ecchymosis  Extremities: normal, no cyanosis, clubbing. Neuro:without focal findings,  speech normal  PSYCHIATRIC: Mood, affect within normal limits.   ASSESSMENT AND PLAN  OSA I suspect that OSA is likely present due to clinical presentation. Discussed the consequences of untreated sleep apnea. Advised not to drive drowsy for safety of patient and others.  Will complete further evaluation with a home sleep study and follow up to review results.    HTN Stable, on current management. Following with PCP.   Obesity Counseled patient on diet and lifestyle modification.   Insomnia Counseled patient on stimulus control and improving sleep hygiene practices.    MEDICATION ADJUSTMENTS/LABS AND TESTS ORDERED: Recommend Sleep Study   Patient  satisfied with Plan of action and management. All questions answered  Follow up to review HST results and treatment plan.   I spent a total of 33 minutes reviewing chart data, face-to-face evaluation with the patient, counseling and coordination of care as detailed above.    Karrington Studnicka, M.D.  Sleep Medicine [facility]

## 2024-02-10 NOTE — Patient Instructions (Signed)
 SABRA

## 2024-02-21 ENCOUNTER — Encounter

## 2024-02-21 DIAGNOSIS — G4733 Obstructive sleep apnea (adult) (pediatric): Secondary | ICD-10-CM

## 2024-03-08 DIAGNOSIS — R069 Unspecified abnormalities of breathing: Secondary | ICD-10-CM | POA: Diagnosis not present

## 2024-03-10 ENCOUNTER — Ambulatory Visit: Payer: Self-pay

## 2024-03-10 DIAGNOSIS — G4733 Obstructive sleep apnea (adult) (pediatric): Secondary | ICD-10-CM

## 2024-03-17 NOTE — Addendum Note (Signed)
 Addended by: Caelin Rosen J on: 03/17/2024 04:14 PM   Modules accepted: Orders

## 2024-03-26 LAB — OPHTHALMOLOGY REPORT-SCANNED

## 2024-04-02 ENCOUNTER — Other Ambulatory Visit: Payer: Self-pay

## 2024-04-02 DIAGNOSIS — E559 Vitamin D deficiency, unspecified: Secondary | ICD-10-CM

## 2024-04-04 NOTE — Telephone Encounter (Signed)
 1. Vitamin D  insufficiency - Vitamin D , Ergocalciferol , (DRISDOL ) 1.25 MG (50000 UNIT) CAPS capsule; TAKE 1 CAPSULE BY MOUTH EVERY 7  DAYS  Dispense: 12 capsule; Refill: 3  Herny Scurlock, MD

## 2024-05-10 ENCOUNTER — Encounter: Payer: Self-pay | Admitting: Sleep Medicine

## 2024-05-10 ENCOUNTER — Ambulatory Visit: Admitting: Sleep Medicine

## 2024-05-10 VITALS — BP 120/80 | HR 83 | Temp 98.1°F | Ht 70.0 in | Wt 230.6 lb

## 2024-05-10 DIAGNOSIS — I10 Essential (primary) hypertension: Secondary | ICD-10-CM

## 2024-05-10 DIAGNOSIS — G4733 Obstructive sleep apnea (adult) (pediatric): Secondary | ICD-10-CM | POA: Diagnosis not present

## 2024-05-10 DIAGNOSIS — E669 Obesity, unspecified: Secondary | ICD-10-CM

## 2024-05-10 NOTE — Progress Notes (Signed)
 Name:Rebecca Hobbs MRN: [id number] DOB: [birth year]   CHIEF COMPLAINT:  CPAP F/U    HISTORY OF PRESENT ILLNESS:  Rebecca Hobbs is a 58 y.o. w/ a h/o OSA, HTN, anxiety, depression, DMII, obesity and hyperlipidemia who presents for CPAP f/u visit. Reports using CPAP therapy every night, which is confirmed by compliance data. She is currently using the Airfit N30i nasal mask, which is comfortable. Reports feeling significantly more refreshed upon awakening with CPAP therapy.    EPWORTH SLEEP SCORE 0    02/10/2024    9:00 AM  Results of the Epworth flowsheet  Sitting and reading 0  Watching TV 0  Sitting, inactive in a public place (e.g. a theatre or a meeting) 0  As a passenger in a car for an hour without a break 0  Lying down to rest in the afternoon when circumstances permit 0  Sitting and talking to someone 0  Sitting quietly after a lunch without alcohol 0  In a car, while stopped for a few minutes in traffic 0  Total score 0    PAST MEDICAL HISTORY :   has a past medical history of Hypertension and Pseudotumor cerebri syndrome.  has a past surgical history that includes Laparoscopic total hysterectomy (2012); Breast biopsy (Left, 1992); and Breast biopsy (Left, 10/16/2016). Prior to Admission medications   Medication Sig Start Date End Date Taking? Authorizing Provider  amLODipine  (NORVASC ) 10 MG tablet Take 1 tablet (10 mg total) by mouth daily. 02/03/24  Yes Bair, Kalpana, MD  Blood Glucose Monitoring Suppl DEVI 1 each by Does not apply route in the morning, at noon, and at bedtime. May substitute to any manufacturer covered by patient's insurance. 02/03/24  Yes Bair, Kalpana, MD  Glucose Blood (BLOOD GLUCOSE TEST STRIPS) STRP 1 each by In Vitro route daily. May substitute to any manufacturer covered by patient's insurance. 02/03/24 01/28/25 Yes Bair, Kalpana, MD  Lancet Device MISC 1 each by Does not apply route daily. May substitute to any manufacturer covered by patient's  insurance. 02/03/24 03/04/24 Yes Bair, Kalpana, MD  Lancets Misc. MISC 1 each by Does not apply route daily. May substitute to any manufacturer covered by patient's insurance. 02/03/24 03/04/24 Yes Bair, Kalpana, MD  Multiple Vitamin (MULTIVITAMIN) tablet Take 1 tablet by mouth daily.   Yes [provider]  pravastatin  (PRAVACHOL ) 20 MG tablet Take 1 tablet (20 mg total) by mouth daily. 02/03/24  Yes Bair, Kalpana, MD  tirzepatide  (MOUNJARO ) 7.5 MG/0.5ML Pen INJECT THE CONTENTS OF ONE PEN  SUBCUTANEOUSLY WEEKLY AS  DIRECTED 02/03/24  Yes Bair, Kalpana, MD  valsartan  (DIOVAN ) 160 MG tablet Take 1 tablet (160 mg total) by mouth daily. 02/03/24  Yes Bair, Kalpana, MD  venlafaxine  XR (EFFEXOR  XR) 75 MG 24 hr capsule Take 1 capsule (75 mg total) by mouth daily with breakfast. 02/03/24  Yes Bair, Kalpana, MD  Vitamin D , Ergocalciferol , (DRISDOL ) 1.25 MG (50000 UNIT) CAPS capsule Take 1 capsule (50,000 Units total) by mouth every 7 (seven) days. 02/04/24  Yes Bair, Kalpana, MD   Allergies  Allergen Reactions   Metformin  And Related Diarrhea and Other (See Comments)    Diarrhea and Stomach Pain   Atorvastatin  Other (See Comments)    Stomach Pain Other reaction(s): Other (See Comments) Stomach Pain   Invokana  [Canagliflozin ] Rash   Lisinopril  Cough    FAMILY HISTORY:  family history includes Arthritis in her maternal grandfather; Breast cancer (age of onset: 45) in her maternal aunt; COPD in  her father; Diabetes in her maternal grandmother and paternal grandmother; Diverticulitis in her sister; Heart disease in her father; Heart disease (age of onset: 60) in her brother; Hypertension in her brother, brother, brother, father, and mother. SOCIAL HISTORY:  reports that she has never smoked. She has never used smokeless tobacco. She reports current alcohol use. She reports that she does not use drugs.   Review of Systems:  Gen:  Denies  fever, sweats, chills weight loss  HEENT: Denies blurred vision, double  vision, ear pain, eye pain, hearing loss, nose bleeds, sore throat Cardiac:  No dizziness, chest pain or heaviness, chest tightness,edema, No JVD Resp:   No cough, -sputum production, -shortness of breath,-wheezing, -hemoptysis,  Gi: Denies swallowing difficulty, stomach pain, nausea or vomiting, diarrhea, constipation, bowel incontinence Gu:  Denies bladder incontinence, burning urine Ext:   Denies Joint pain, stiffness or swelling Skin: Denies  skin rash, easy bruising or bleeding or hives Endoc:  Denies polyuria, polydipsia , polyphagia or weight change Psych:   Denies depression, insomnia or hallucinations  Other:  All other systems negative  VITAL SIGNS: BP 120/80   Pulse 83   Temp 98.1 F (36.7 C)   Ht 5' 10 (1.778 m)   Wt 230 lb 9.6 oz (104.6 kg)   SpO2 99%   BMI 33.09 kg/m    Physical Examination:   General Appearance: No distress  EYES PERRLA, EOM intact.   NECK Supple, No JVD Pulmonary: normal breath sounds, No wheezing.  CardiovascularNormal S1,S2.  No m/r/g.   Abdomen: Benign, Soft, non-tender. Skin:   warm, no rashes, no ecchymosis  Extremities: normal, no cyanosis, clubbing. Neuro:without focal findings,  speech normal  PSYCHIATRIC: Mood, affect within normal limits.   ASSESSMENT AND PLAN  OSA Patient is using and benefiting from CPAP therapy. Discussed the consequences of untreated sleep apnea. Advised not to drive drowsy for safety of patient and others. Will follow up in 6 months.    HTN Stable, on current management. Following with PCP.   Obesity Counseled patient on diet and lifestyle modification.    Patient  satisfied with Plan of action and management. All questions answered  I spent a total of 31 minutes reviewing chart data, face-to-face evaluation with the patient, counseling and coordination of care as detailed above.    Anaclara Acklin, M.D.  Sleep Medicine [facility]

## 2024-05-10 NOTE — Patient Instructions (Addendum)

## 2024-08-03 ENCOUNTER — Other Ambulatory Visit: Payer: Self-pay

## 2024-08-03 DIAGNOSIS — F39 Unspecified mood [affective] disorder: Secondary | ICD-10-CM

## 2024-08-06 ENCOUNTER — Ambulatory Visit

## 2024-09-07 ENCOUNTER — Ambulatory Visit
